# Patient Record
Sex: Male | Born: 1945 | Race: White | Hispanic: No | Smoking: Never smoker
Health system: Southern US, Community
[De-identification: ages and names within clinical notes are randomized; demographics above are authoritative.]

## PROBLEM LIST (undated history)

## (undated) DIAGNOSIS — C801 Malignant (primary) neoplasm, unspecified: Secondary | ICD-10-CM

## (undated) DIAGNOSIS — K219 Gastro-esophageal reflux disease without esophagitis: Secondary | ICD-10-CM

## (undated) DIAGNOSIS — I10 Essential (primary) hypertension: Secondary | ICD-10-CM

## (undated) DIAGNOSIS — IMO0002 Reserved for concepts with insufficient information to code with codable children: Secondary | ICD-10-CM

## (undated) DIAGNOSIS — M779 Enthesopathy, unspecified: Secondary | ICD-10-CM

## (undated) DIAGNOSIS — E785 Hyperlipidemia, unspecified: Secondary | ICD-10-CM

## (undated) DIAGNOSIS — T7840XA Allergy, unspecified, initial encounter: Secondary | ICD-10-CM

## (undated) HISTORY — DX: Malignant (primary) neoplasm, unspecified: C80.1

## (undated) HISTORY — DX: Reserved for concepts with insufficient information to code with codable children: IMO0002

## (undated) HISTORY — PX: TONSILLECTOMY: SUR1361

## (undated) HISTORY — DX: Hyperlipidemia, unspecified: E78.5

## (undated) HISTORY — DX: Essential (primary) hypertension: I10

## (undated) HISTORY — DX: Enthesopathy, unspecified: M77.9

## (undated) HISTORY — PX: COLONOSCOPY: SHX174

## (undated) HISTORY — DX: Gastro-esophageal reflux disease without esophagitis: K21.9

## (undated) HISTORY — DX: Allergy, unspecified, initial encounter: T78.40XA

---

## 2006-08-27 ENCOUNTER — Encounter: Payer: Self-pay | Admitting: Cardiovascular Disease

## 2006-09-06 ENCOUNTER — Encounter: Payer: Self-pay | Admitting: Cardiovascular Disease

## 2007-09-17 ENCOUNTER — Encounter: Payer: Self-pay | Admitting: Cardiovascular Disease

## 2007-10-15 ENCOUNTER — Ambulatory Visit (HOSPITAL_COMMUNITY): Admission: RE | Admit: 2007-10-15 | Discharge: 2007-10-15 | Payer: Self-pay | Admitting: Cardiovascular Disease

## 2008-02-25 ENCOUNTER — Ambulatory Visit (HOSPITAL_COMMUNITY): Admission: RE | Admit: 2008-02-25 | Discharge: 2008-02-25 | Payer: Self-pay | Admitting: Cardiovascular Disease

## 2008-03-17 ENCOUNTER — Encounter: Admission: RE | Admit: 2008-03-17 | Discharge: 2008-03-17 | Payer: Self-pay | Admitting: Internal Medicine

## 2008-08-11 ENCOUNTER — Ambulatory Visit: Payer: Self-pay | Admitting: Thoracic Surgery

## 2008-11-03 ENCOUNTER — Ambulatory Visit: Payer: Self-pay | Admitting: Thoracic Surgery

## 2008-11-03 ENCOUNTER — Encounter: Admission: RE | Admit: 2008-11-03 | Discharge: 2008-11-03 | Payer: Self-pay | Admitting: Thoracic Surgery

## 2008-12-03 ENCOUNTER — Ambulatory Visit: Payer: Self-pay | Admitting: Thoracic Surgery

## 2009-08-02 ENCOUNTER — Encounter: Admission: RE | Admit: 2009-08-02 | Discharge: 2009-08-02 | Payer: Self-pay | Admitting: Thoracic Surgery

## 2009-08-02 ENCOUNTER — Ambulatory Visit: Payer: Self-pay | Admitting: Thoracic Surgery

## 2010-02-10 ENCOUNTER — Encounter: Payer: Self-pay | Admitting: Cardiovascular Disease

## 2010-03-08 ENCOUNTER — Encounter: Payer: Self-pay | Admitting: Cardiovascular Disease

## 2010-03-09 ENCOUNTER — Encounter: Payer: Self-pay | Admitting: Cardiovascular Disease

## 2010-03-13 ENCOUNTER — Encounter: Payer: Self-pay | Admitting: Cardiovascular Disease

## 2010-03-15 ENCOUNTER — Encounter: Payer: Self-pay | Admitting: Cardiovascular Disease

## 2010-03-15 ENCOUNTER — Ambulatory Visit (INDEPENDENT_AMBULATORY_CARE_PROVIDER_SITE_OTHER): Payer: Self-pay | Admitting: Cardiovascular Disease

## 2010-03-15 DIAGNOSIS — I10 Essential (primary) hypertension: Secondary | ICD-10-CM

## 2010-03-15 DIAGNOSIS — E785 Hyperlipidemia, unspecified: Secondary | ICD-10-CM | POA: Insufficient documentation

## 2010-03-15 DIAGNOSIS — R0602 Shortness of breath: Secondary | ICD-10-CM | POA: Insufficient documentation

## 2010-03-21 NOTE — Letter (Signed)
Summary: Medical Record Release  Medical Record Release   Imported By: Harlon Flor 03/17/2010 16:31:54  _____________________________________________________________________  External Attachment:    Type:   Image     Comment:   External Document

## 2010-03-21 NOTE — Assessment & Plan Note (Signed)
Summary: SOB/AMD   Visit Type:  Initial Consult Referring Provider:  Dr.Tate Primary Provider:  Dr.Tate  CC:  c/o elevated BP, SOB, and tingling in fingers and trouble with balance. Denies chest pain.Marland Kitchen  History of Present Illness: Martin Lopez is a very pleasant 65 year old gentleman, patient of Dr. Arlana Pouch, known to me from [facility] with last visit in August 2009, with a history of hypertension, hyperlipidemia, strong family history of coronary artery disease who presents for shortness of breath and to reestablish care.  He reports that over the past 90 days, he has noticed increasing shortness of breath. He also has some tingling in his fingers, mild balance issues. His blood pressure has also been very elevated. He has had 2 add additional medications, starting amlodipine 5 mg daily.  He continues to treadmill every morning for the 16-24 minutes at a speed of 4 miles per hour on a flat level. He does not have worsening shortness of breath with exertion but notices his symptoms more at rest. He denies any significant chest pain. No significant lower extremity edema. He does have periodic cough.  EKG shows normal sinus rhythm with rate of 68 beats per minute, possible old septal infarct, no significant ST or T wave changes    Preventive Screening-Counseling & Management  Alcohol-Tobacco     Smoking Status: never  Caffeine-Diet-Exercise     Does Patient Exercise: yes      Drug Use:  no.    Current Medications (verified): 1)  Pantoprazole Sodium 40 Mg Tbec (Pantoprazole Sodium) .Marland Kitchen.. 1 Tablet Once Daily 2)  Vytorin 10-20 Mg Tabs (Ezetimibe-Simvastatin) .Marland Kitchen.. 1 Tablet Once Daily 3)  Terazosin Hcl 10 Mg Caps (Terazosin Hcl) .Marland Kitchen.. 1 Tablet Once Daily 4)  Avapro 300 Mg Tabs (Irbesartan) .Marland Kitchen.. 1 Tablet Once Daily  Allergies (verified): No Known Drug Allergies  Past History:  Family History: Last updated: April 14, 2010 Father:deceased-brain tumor Mother:ovarian  cancer maternal uncles-CAD  Social History: Last updated: 2010-04-14 Full Time Divorced  Tobacco Use - No.  Alcohol Use - yes-1 mixed drink a day Drug Use - no Regular Exercise - yes-uses treadmill  Risk Factors: Exercise: yes (04-14-10)  Risk Factors: Smoking Status: never (Apr 14, 2010)  Past Medical History: Hypertension Hyperlipidemia  Family History: Father:deceased-brain tumor Mother:ovarian cancer maternal uncles-CAD  Social History: Full Time Divorced  Tobacco Use - No.  Alcohol Use - yes-1 mixed drink a day Drug Use - no Regular Exercise - yes-uses treadmill Smoking Status:  never Drug Use:  no Does Patient Exercise:  yes  Review of Systems       The patient complains of dyspnea on exertion.  The patient denies fever, weight loss, weight gain, vision loss, decreased hearing, hoarseness, chest pain, syncope, peripheral edema, prolonged cough, abdominal pain, incontinence, muscle weakness, depression, and enlarged lymph nodes.         mild balance issues, tingling in his fingers  Vital Signs:  Patient profile:   65 year old male Height:      68 inches Weight:      175.50 pounds BMI:     26.78 Pulse rate:   68 / minute BP sitting:   151 / 99  (left arm) Cuff size:   regular  Vitals Entered By: Lysbeth Galas CMA (04/14/2010 11:58 AM)  Physical Exam  General:  Well developed, well nourished, in no acute distress. Head:  normocephalic and atraumatic Neck:  Neck supple, no JVD. No masses, thyromegaly or abnormal cervical nodes. Lungs:  Clear bilaterally to auscultation and percussion. Heart:  Non-displaced PMI, chest non-tender; regular rate and rhythm, S1, S2 without murmurs, rubs or gallops. Carotid upstroke normal, no bruit. Normal abdominal aortic size, no bruits. Pedals normal pulses. No edema, no varicosities. Abdomen:  Bowel sounds positive; abdomen soft and non-tender without masses Msk:  Back normal, normal gait. Muscle strength and  tone normal. Pulses:  pulses normal in all 4 extremities Extremities:  No clubbing or cyanosis. Neurologic:  Alert and oriented x 3. Skin:  Intact without lesions or rashes. Psych:  Normal affect.   Impression & Recommendations:  Problem # 1:  SHORTNESS OF BREATH (ICD-786.05) etiology of his shortness of breath is uncertain. Clinically, he appears well. EKG is essentially normal. He has no known underlying coronary artery disease and has been well-managed with good cholesterol. Blood pressure is borderline elevated.  Given his shortness of breath, he is requesting additional workup, and we have ordered an echocardiogram. We will send a report to Dr. Arlana Pouch when it is available and call the patient with the results. uncertain if additional workup will be needed. Previous calcium score in 2009 was zero.  We will try to obtain the CT report done this past year from [facility] for our records.  His updated medication list for this problem includes:    Avapro 300 Mg Tabs (Irbesartan) .Marland Kitchen... 1 tablet once daily    Amlodipine Besylate 5 Mg Tabs (Amlodipine besylate) .Marland Kitchen... Take one tablet by mouth daily    Aspirin 81 Mg Tbec (Aspirin) .Marland Kitchen... Take one tablet by mouth daily  Orders: EKG w/ Interpretation (93000) Echocardiogram (Echo)  Problem # 2:  HYPERTENSION, BENIGN (ICD-401.1) repeat blood pressure was 135/88. We have suggested that he continue to monitor his blood pressure for now. If it continues to be elevated, he should watch his diet, continue exercise, decrease salt intake, possibly increase amlodipine to 10 mg daily.  His updated medication list for this problem includes:    Terazosin Hcl 10 Mg Caps (Terazosin hcl) .Marland Kitchen... 1 tablet once daily    Avapro 300 Mg Tabs (Irbesartan) .Marland Kitchen... 1 tablet once daily    Amlodipine Besylate 5 Mg Tabs (Amlodipine besylate) .Marland Kitchen... Take one tablet by mouth daily    Aspirin 81 Mg Tbec (Aspirin) .Marland Kitchen... Take one tablet by mouth daily  Problem # 3:   HYPERLIPIDEMIA-MIXED (ICD-272.4) Cholesterol is reasonably well controlled on his current medication regimen.  His updated medication list for this problem includes:    Vytorin 10-20 Mg Tabs (Ezetimibe-simvastatin) .Marland Kitchen... 1 tablet once daily  Problem # 4:  PREVENTIVE HEALTH CARE (ICD-V70.0) We will recommend continuing on aspirin 81 mg x1. Continue to watch his diet, exercise as he is doing.  Patient Instructions: 1)  Your physician has requested that you have an echocardiogram.  Echocardiography is a painless test that uses sound waves to create images of your heart. It provides your doctor with information about the size and shape of your heart and how well your heart's chambers and valves are working.  This procedure takes approximately one hour. There are no restrictions for this procedure. 03/30/10 @ 9am

## 2010-03-30 ENCOUNTER — Other Ambulatory Visit: Payer: Self-pay | Admitting: Cardiovascular Disease

## 2010-03-30 ENCOUNTER — Other Ambulatory Visit (INDEPENDENT_AMBULATORY_CARE_PROVIDER_SITE_OTHER): Payer: BC Managed Care – PPO

## 2010-03-30 DIAGNOSIS — R0602 Shortness of breath: Secondary | ICD-10-CM

## 2010-03-30 DIAGNOSIS — R895 Abnormal microbiological findings in specimens from other organs, systems and tissues: Secondary | ICD-10-CM

## 2010-04-04 NOTE — Consult Note (Signed)
Summary: Internal Medicine: Progress Note  Internal Medicine: Progress Note   Imported By: Earl Many 03/28/2010 13:25:49  _____________________________________________________________________  External Attachment:    Type:   Image     Comment:   External Document

## 2010-04-04 NOTE — Progress Notes (Signed)
Summary: [facility]: Office Visit  [facility]: Office Visit   Imported By: Earl Many 03/28/2010 13:05:01  _____________________________________________________________________  External Attachment:    Type:   Image     Comment:   External Document

## 2010-04-04 NOTE — Progress Notes (Signed)
Summary: [facility]: Office Visit  [facility]: Office Visit   Imported By: Earl Many 03/28/2010 13:07:26  _____________________________________________________________________  External Attachment:    Type:   Image     Comment:   External Document

## 2010-06-06 NOTE — Letter (Signed)
November 03, 2008   Antonieta Iba, MD  [address]  [address]  [place], [place] [address]   Re:  Martin Lopez, Martin Lopez              DOB:  1945-02-08   Dear Dr. Mariah Milling,   I saw the patient in the office today and his blood pressure is 140/90,  pulse 59, respirations 18, sats were 98%.  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  Because of this, we will just follow him again in about 9 months  and this will probably be his last followup.   I appreciate the opportunity of seeing the patient.   Sincerely,   Ines Bloomer, M.D.  Electronically Signed   DPB/MEDQ  D:  11/03/2008  T:  11/04/2008  Job:  [id number]

## 2010-06-06 NOTE — Letter (Signed)
August 02, 2009   Antonieta Iba, MD  [address]  [address]  [place] [address]   Re:  Martin Lopez, Martin Lopez              DOB:  [birth year]   Dear Dr. Lewie Loron:   I saw the patient now for followup of his nodules, which we have been  following for approximately 2 years and there has been no change and  according to [place] these are probably all benign.  I would recommend that  he get another CT scan in 1 year and I will be happy to see him should  any other pulmonary problems develop.  His blood pressure is 142/86,  pulse 85, respirations 18, saturations were 97%.  Lungs are clear to  auscultation and percussion.   Ines Bloomer, M.D.  Electronically Signed   DPB/MEDQ  D:  08/02/2009  T:  08/03/2009  Job:  [id number]

## 2010-06-06 NOTE — Letter (Signed)
August 11, 2008   Antonieta Iba, MD  [address]  [address]  [place], [place] [address]   Re:  Martin Lopez, Martin Lopez              DOB:  03-12-1945   Dear Dr. Mariah Milling:   I saw the patient today for his lung nodules.  This 65 year old  Caucasian male, who was being worked up for cardiac disease, was found  to have a left lower lobe nodule near the fissure.  He had a followup CT  scan that showed a right middle lobe nodule, 8 mm in size and stable  left lower lobe nodules, both of which were noncalcified.  He has had no  fever, chills, or excessive sputum.  He does not smoke.   PAST MEDICAL HISTORY:  Significant for hypertension, hyperlipidemia,  previous cardiac workup was negative.  His medications include Nexium 40  mg a day, multivitamin, aspirin, Viagra, Avapro, Vytorin, and terazosin  5 mg a day.   FAMILY HISTORY:  Noncontributory.   SOCIAL HISTORY:  He is single.  He works in [employer].  He does  not smoke, has occasional drink per day.   REVIEW OF SYSTEMS:  He is 175 pounds.  He is 5 feet 8.  General:  His  weight has been stable.  Cardiac:  See history of present illness.  Pulmonary:  No hemoptysis.  GI:  No nausea, vomiting, constipation, or  diarrhea.  GU:  No kidney disease, dysuria, or frequent urination; has  probably some BPH.  Vascular:  No claudication, DVT, or TIAs.  Neurological:  No dizziness, headaches, blackouts, or seizures.  Musculoskeletal:  No arthritis or joint pain.  No psychiatric illnesses.  Eyes/ENT:  No change in his eyesight or hearing.  Hematological:  No  problems with bleeding, clotting disorders, or anemia.   PHYSICAL EXAMINATION:  GENERAL:  He is well-developed Caucasian male in  no acute distress.  VITAL SIGNS:  His blood pressure is 128/78, pulse 96, respirations 18,  and saturations were 97%.  HEAD, EYES, EARS, NOSE, AND THROAT:  Unremarkable.  NECK:  Supple without thyromegaly.  There is no supraclavicular or  axillary adenopathy.  CHEST:  Clear to auscultation and percussion.  HEART:  Regular sinus rhythm.  No murmurs.  ABDOMEN:  Soft.  No hepatosplenomegaly.  EXTREMITIES:  Pulses are 2+.  There is no clubbing or edema.  NEUROLOGICAL:  He is oriented x3.  Sensory and motor intact.  Cranial  nerves intact.   I think these two nodules just need to be followed.  Since it is under 8  mm, PET scan would not be helpful and obviously biopsy of such small  nodules could not be done with needle biopsy.  Since the left nodule has  been stable, we just need to make sure that the right nodule has been  stable also, so I plan to repeat.  The last CT scan was in February, so  we will plan to get his next CT in 2 months.  I will let you know our  findings.   Sincerely,   Ines Bloomer, M.D.  Electronically Signed   DPB/MEDQ  D:  08/11/2008  T:  08/12/2008  Job:  [id number]   cc:   Dr. Arlana Pouch

## 2010-08-15 ENCOUNTER — Encounter: Payer: Self-pay | Admitting: Cardiovascular Disease

## 2011-01-23 DIAGNOSIS — IMO0002 Reserved for concepts with insufficient information to code with codable children: Secondary | ICD-10-CM

## 2011-01-23 HISTORY — DX: Reserved for concepts with insufficient information to code with codable children: IMO0002

## 2011-01-23 HISTORY — PX: BASAL CELL CARCINOMA EXCISION: SHX1214

## 2011-03-08 ENCOUNTER — Ambulatory Visit: Payer: Self-pay | Admitting: Specialist

## 2011-03-21 ENCOUNTER — Ambulatory Visit (INDEPENDENT_AMBULATORY_CARE_PROVIDER_SITE_OTHER): Payer: BC Managed Care – PPO | Admitting: Cardiovascular Disease

## 2011-03-21 ENCOUNTER — Encounter: Payer: Self-pay | Admitting: Cardiovascular Disease

## 2011-03-21 VITALS — BP 118/86 | HR 66 | Ht 68.0 in | Wt 172.0 lb

## 2011-03-21 DIAGNOSIS — E785 Hyperlipidemia, unspecified: Secondary | ICD-10-CM

## 2011-03-21 DIAGNOSIS — I10 Essential (primary) hypertension: Secondary | ICD-10-CM

## 2011-03-21 DIAGNOSIS — R0602 Shortness of breath: Secondary | ICD-10-CM

## 2011-03-21 NOTE — Assessment & Plan Note (Signed)
Cholesterol is at goal on the current lipid regimen. No changes to the medications were made.  

## 2011-03-21 NOTE — Patient Instructions (Signed)
You are doing well. No medication changes were made. Monitor your blood pressure   Please call us if you have new issues that need to be addressed before your next appt.  Your physician wants you to follow-up in: 12 months.  You will receive a reminder letter in the mail two months in advance. If you don't receive a letter, please call our office to schedule the follow-up appointment.

## 2011-03-21 NOTE — Assessment & Plan Note (Addendum)
No significant shortness of breath on today's visit. Exercising well without complaints. No further workup needed at this time.

## 2011-03-21 NOTE — Progress Notes (Signed)
   Patient ID: Martin Lopez, male    DOB: 18-Aug-1945, 66 y.o.   MRN: [id number]  HPI Comments: Martin Lopez is a very pleasant 66 year old gentleman, patient of Dr. Arlana Pouch,  with a history of hypertension, hyperlipidemia, strong family history of coronary artery disease who presents  for routine followup.   He has been exercising well with no significant symptoms. No shortness of breath or chest pain when using the treadmill. He's been trying to watch his weight and his diet.  No significant lower extremity edema.    EKG shows normal sinus rhythm with rate of 66 beats per minute, possible old septal infarct, no significant ST or T wave changes     Outpatient Encounter Prescriptions as of 03/21/2011  Medication Sig Dispense Refill  . amLODipine (NORVASC) 5 MG tablet Take 5 mg by mouth daily.        Marland Kitchen aspirin (ASPIR-81) 81 MG EC tablet Take 81 mg by mouth daily.        Marland Kitchen esomeprazole (NEXIUM) 40 MG capsule Take 40 mg by mouth daily before breakfast.      . irbesartan (AVAPRO) 300 MG tablet Take 300 mg by mouth daily.        . rosuvastatin (CRESTOR) 10 MG tablet Take 10 mg by mouth daily.      Marland Kitchen terazosin (HYTRIN) 10 MG capsule Take 10 mg by mouth daily.        Marland Kitchen zolpidem (AMBIEN) 5 MG tablet Take 5 mg by mouth at bedtime as needed.        Review of Systems  Constitutional: Negative.   HENT: Negative.   Eyes: Negative.   Respiratory: Negative.   Cardiovascular: Negative.   Gastrointestinal: Negative.   Musculoskeletal: Negative.   Skin: Negative.   Neurological: Negative.   Hematological: Negative.   Psychiatric/Behavioral: Negative.   All other systems reviewed and are negative.    BP 118/86  Pulse 66  Ht 5\' 8"  (1.727 m)  Wt 172 lb (78.019 kg)  BMI 26.15 kg/m2   Physical Exam  Nursing note and vitals reviewed. Constitutional: He is oriented to person, place, and time. He appears well-developed and well-nourished.  HENT:  Head: Normocephalic.  Nose: Nose normal.    Mouth/Throat: Oropharynx is clear and moist.  Eyes: Conjunctivae are normal. Pupils are equal, round, and reactive to light.  Neck: Normal range of motion. Neck supple. No JVD present.  Cardiovascular: Normal rate, regular rhythm, S1 normal, S2 normal, normal heart sounds and intact distal pulses.  Exam reveals no gallop and no friction rub.   No murmur heard. Pulmonary/Chest: Effort normal and breath sounds normal. No respiratory distress. He has no wheezes. He has no rales. He exhibits no tenderness.  Abdominal: Soft. Bowel sounds are normal. He exhibits no distension. There is no tenderness.  Musculoskeletal: Normal range of motion. He exhibits no edema and no tenderness.  Lymphadenopathy:    He has no cervical adenopathy.  Neurological: He is alert and oriented to person, place, and time. Coordination normal.  Skin: Skin is warm and dry. No rash noted. No erythema.  Psychiatric: He has a normal mood and affect. His behavior is normal. Judgment and thought content normal.           Assessment and Plan

## 2011-03-21 NOTE — Assessment & Plan Note (Signed)
Blood pressure is well controlled on today's visit. No changes made to the medications. 

## 2011-07-12 ENCOUNTER — Ambulatory Visit: Payer: Self-pay | Admitting: Internal Medicine

## 2011-10-03 ENCOUNTER — Ambulatory Visit: Payer: Self-pay | Admitting: General Surgery

## 2012-03-17 ENCOUNTER — Encounter: Payer: Self-pay | Admitting: Cardiovascular Disease

## 2012-03-17 ENCOUNTER — Ambulatory Visit (INDEPENDENT_AMBULATORY_CARE_PROVIDER_SITE_OTHER): Payer: Commercial Managed Care - PPO | Admitting: Cardiovascular Disease

## 2012-03-17 VITALS — BP 122/82 | HR 64 | Ht 68.0 in | Wt 171.5 lb

## 2012-03-17 DIAGNOSIS — I10 Essential (primary) hypertension: Secondary | ICD-10-CM

## 2012-03-17 DIAGNOSIS — R252 Cramp and spasm: Secondary | ICD-10-CM

## 2012-03-17 DIAGNOSIS — E785 Hyperlipidemia, unspecified: Secondary | ICD-10-CM

## 2012-03-17 DIAGNOSIS — M542 Cervicalgia: Secondary | ICD-10-CM

## 2012-03-17 NOTE — Assessment & Plan Note (Signed)
Cholesterol currently well-controlled. He does have muscle aches. We'll hold his statin for now.

## 2012-03-17 NOTE — Assessment & Plan Note (Signed)
He has tightness in his neck, left flank, sometimes legs. We have suggested he do a trial hold his Crestor to see if symptoms improve over the next several weeks. If he does have improvement, we will try an alternate statin and low-dose. If no improvement, he could restart Crestor.

## 2012-03-17 NOTE — Assessment & Plan Note (Signed)
He reports having a bone spur. He has had MRIs and physical therapy with minimal improvement.

## 2012-03-17 NOTE — Progress Notes (Signed)
Patient ID: Martin Lopez, male    DOB: [birth year], 67 y.o.   MRN: [id number]  HPI Comments: Mr. Martin Lopez is a very pleasant 67year-old gentleman, patient of Dr. Arlana Pouch,  with a history of hypertension, hyperlipidemia, strong family history of coronary artery disease who presents  for routine followup.   His biggest complaint is chronic neck pain, hypersensitivity, some tingling down his arms. Also has left lower rib/flank pain that comes and goes. Also has cramping in his muscles, sometimes legs, sometimes neck.   He exercises on a regular basis uses the treadmill.No shortness of breath or chest pain when using the treadmill. He's been trying to watch his weight and his diet. No significant lower extremity edema.   Cholesterol well controlled less than 150 Crestor 10 mg daily No recent lipid panel since August 2013   EKG shows normal sinus rhythm with rate of 64 beats per minute, possible old septal infarct, no significant ST or T wave changes     Outpatient Encounter Prescriptions as of 03/17/2012  Medication Sig Dispense Refill  . amLODipine (NORVASC) 5 MG tablet Take 5 mg by mouth daily.        Marland Kitchen aspirin (ASPIR-81) 81 MG EC tablet Take 81 mg by mouth daily.        Marland Kitchen esomeprazole (NEXIUM) 40 MG capsule Take 40 mg by mouth daily before breakfast.      . irbesartan (AVAPRO) 300 MG tablet Take 300 mg by mouth daily.        . rosuvastatin (CRESTOR) 10 MG tablet Take 10 mg by mouth daily.      Marland Kitchen terazosin (HYTRIN) 10 MG capsule Take 10 mg by mouth daily.        Marland Kitchen zolpidem (AMBIEN) 5 MG tablet Take 5 mg by mouth at bedtime as needed.       No facility-administered encounter medications on file as of 03/17/2012.    Review of Systems  Constitutional: Negative.   HENT: Negative.   Eyes: Negative.   Respiratory: Negative.   Cardiovascular: Negative.   Gastrointestinal: Negative.   Musculoskeletal: Negative.   Skin: Negative.   Neurological: Negative.   Psychiatric/Behavioral: Negative.    All other systems reviewed and are negative.    BP 122/82  Pulse 64  Ht 5\' 8"  (1.727 m)  Wt 171 lb 8 oz (77.792 kg)  BMI 26.08 kg/m2  Physical Exam  Nursing note and vitals reviewed. Constitutional: He is oriented to person, place, and time. He appears well-developed and well-nourished.  HENT:  Head: Normocephalic.  Nose: Nose normal.  Mouth/Throat: Oropharynx is clear and moist.  Eyes: Conjunctivae are normal. Pupils are equal, round, and reactive to light.  Neck: Normal range of motion. Neck supple. No JVD present.  Cardiovascular: Normal rate, regular rhythm, S1 normal, S2 normal, normal heart sounds and intact distal pulses.  Exam reveals no gallop and no friction rub.   No murmur heard. Pulmonary/Chest: Effort normal and breath sounds normal. No respiratory distress. He has no wheezes. He has no rales. He exhibits no tenderness.  Abdominal: Soft. Bowel sounds are normal. He exhibits no distension. There is no tenderness.  Musculoskeletal: Normal range of motion. He exhibits no edema and no tenderness.  Lymphadenopathy:    He has no cervical adenopathy.  Neurological: He is alert and oriented to person, place, and time. Coordination normal.  Skin: Skin is warm and dry. No rash noted. No erythema.  Psychiatric: He has a normal mood and affect. His behavior is  normal. Judgment and thought content normal.      Assessment and Plan

## 2012-03-17 NOTE — Assessment & Plan Note (Signed)
Blood pressure well controlled. He is interested in weaning himself down off some of his medications. We have suggested he could cut his irbesartan in half and take 150 mg daily with close monitoring of his blood pressure.

## 2012-03-17 NOTE — Patient Instructions (Addendum)
You are doing well. Please do a trial hold on the crestor to see if neck and legs, aches, get better (for cholesterol) If no better in a month, call the office  Check the price of the   (for blood pressure)  Cut the irbesartan in 1/2 and monitor blood pressure  Goal top number <140, bottom < 90  Please call us if you have new issues that need to be addressed before your next appt.  Your physician wants you to follow-up in: 12 months.  You will receive a reminder letter in the mail two months in advance. If you don't receive a letter, please call our office to schedule the follow-up appointment.

## 2012-03-27 ENCOUNTER — Other Ambulatory Visit: Payer: Self-pay | Admitting: Neurosurgery

## 2012-03-27 DIAGNOSIS — M47812 Spondylosis without myelopathy or radiculopathy, cervical region: Secondary | ICD-10-CM

## 2012-04-03 ENCOUNTER — Ambulatory Visit
Admission: RE | Admit: 2012-04-03 | Discharge: 2012-04-03 | Disposition: A | Discharge status: Home / Self Care | Payer: Commercial Managed Care - PPO | Source: Ambulatory Visit | Attending: Neurosurgery | Admitting: Neurosurgery

## 2012-04-03 DIAGNOSIS — M47812 Spondylosis without myelopathy or radiculopathy, cervical region: Secondary | ICD-10-CM

## 2013-01-05 DIAGNOSIS — D239 Other benign neoplasm of skin, unspecified: Secondary | ICD-10-CM

## 2013-01-05 DIAGNOSIS — C4491 Basal cell carcinoma of skin, unspecified: Secondary | ICD-10-CM

## 2013-01-05 HISTORY — DX: Other benign neoplasm of skin, unspecified: D23.9

## 2013-01-05 HISTORY — DX: Basal cell carcinoma of skin, unspecified: C44.91

## 2013-03-17 ENCOUNTER — Ambulatory Visit: Payer: Medicare Other | Admitting: Cardiovascular Disease

## 2013-04-06 ENCOUNTER — Encounter: Payer: Self-pay | Admitting: Cardiovascular Disease

## 2013-04-06 ENCOUNTER — Ambulatory Visit (INDEPENDENT_AMBULATORY_CARE_PROVIDER_SITE_OTHER): Payer: Medicare Other | Admitting: Cardiovascular Disease

## 2013-04-06 VITALS — BP 120/80 | HR 67 | Ht 68.0 in | Wt 170.5 lb

## 2013-04-06 DIAGNOSIS — E785 Hyperlipidemia, unspecified: Secondary | ICD-10-CM

## 2013-04-06 DIAGNOSIS — I10 Essential (primary) hypertension: Secondary | ICD-10-CM

## 2013-04-06 MED ORDER — IRBESARTAN 300 MG PO TABS: 300.0000 mg | ORAL_TABLET | Freq: Every day | ORAL | Status: DC

## 2013-04-06 NOTE — Progress Notes (Signed)
   Patient ID: Martin Lopez, male    DOB: 08/29/1945, 68 y.o.   MRN: [id number]  HPI Comments: Martin Lopez is a very pleasant 68 year-old gentleman, patient of Dr. Hall Busing,  with a history of hypertension, hyperlipidemia, strong family history of coronary artery disease who presents  for routine followup. Prior cardiac calcium score of  Zero 3 years ago   Martin Lopez has chronic neck pain,  left and right lower rib/flank pain that comes and goes.  Cramping in his muscles has resolved with low-dose Crestor 5 mg  Martin Lopez exercises on a regular basis uses the treadmill.No shortness of breath or chest pain when using the treadmill. Martin Lopez's been trying to watch his weight and his diet. No significant lower extremity edema.   Cholesterol has climbed slightly on low-dose Crestor now up to 176, LDL 78, HDL 90 No recent lipid panel since August 2013   EKG shows normal sinus rhythm with rate 67 beats per minute, no significant ST or T wave changes     Outpatient Encounter Prescriptions as of 04/06/2013  Medication Sig  . amLODipine (NORVASC) 5 MG tablet Take 5 mg by mouth daily.    Marland Kitchen aspirin (ASPIR-81) 81 MG EC tablet Take 81 mg by mouth daily.    Marland Kitchen esomeprazole (NEXIUM) 40 MG capsule Take 40 mg by mouth daily before breakfast.  . irbesartan (AVAPRO) 300 MG tablet Take 150 mg by mouth daily.   . rosuvastatin (CRESTOR) 10 MG tablet Take 5 mg by mouth daily.   Marland Kitchen terazosin (HYTRIN) 10 MG capsule Take 10 mg by mouth daily.    Marland Kitchen zolpidem (AMBIEN) 5 MG tablet Take 5 mg by mouth at bedtime as needed.    Review of Systems  Constitutional: Negative.   HENT: Negative.   Eyes: Negative.   Respiratory: Negative.   Cardiovascular: Negative.   Gastrointestinal: Negative.   Endocrine: Negative.   Musculoskeletal: Negative.   Skin: Negative.   Allergic/Immunologic: Negative.   Neurological: Negative.   Hematological: Negative.   Psychiatric/Behavioral: Negative.   All other systems reviewed and are negative.    BP  120/80  Pulse 67  Ht 5\' 8"  (1.727 m)  Wt 170 lb 8 oz (77.338 kg)  BMI 25.93 kg/m2  Physical Exam  Nursing note and vitals reviewed. Constitutional: Martin Lopez is oriented to person, place, and time. Martin Lopez appears well-developed and well-nourished.  HENT:  Head: Normocephalic.  Nose: Nose normal.  Mouth/Throat: Oropharynx is clear and moist.  Eyes: Conjunctivae are normal. Pupils are equal, round, and reactive to light.  Neck: Normal range of motion. Neck supple. No JVD present.  Cardiovascular: Normal rate, regular rhythm, S1 normal, S2 normal, normal heart sounds and intact distal pulses.  Exam reveals no gallop and no friction rub.   No murmur heard. Pulmonary/Chest: Effort normal and breath sounds normal. No respiratory distress. Martin Lopez has no wheezes. Martin Lopez has no rales. Martin Lopez exhibits no tenderness.  Abdominal: Soft. Bowel sounds are normal. Martin Lopez exhibits no distension. There is no tenderness.  Musculoskeletal: Normal range of motion. Martin Lopez exhibits no edema and no tenderness.  Lymphadenopathy:    Martin Lopez has no cervical adenopathy.  Neurological: Martin Lopez is alert and oriented to person, place, and time. Coordination normal.  Skin: Skin is warm and dry. No rash noted. No erythema.  Psychiatric: Martin Lopez has a normal mood and affect. His behavior is normal. Judgment and thought content normal.      Assessment and Plan

## 2013-04-06 NOTE — Patient Instructions (Signed)
Please take terazosin at night Take a full irbesartan 300 mg pill Hold the amlodipine Monitor your blood pressure  Please call us if you have new issues that need to be addressed before your next appt.  Your physician wants you to follow-up in: 12 months.  You will receive a reminder letter in the mail two months in advance. If you don't receive a letter, please call our office to schedule the follow-up appointment.

## 2013-04-06 NOTE — Assessment & Plan Note (Signed)
He is interested in decreasing his medication list. One option would be to hold the amlodipine given his dizziness at times, increase the irbesartan back to 300 mg daily. Alternatively he could hold the irbesartan and increase amlodipine up to 10 mg daily. He will try the former first

## 2013-04-06 NOTE — Assessment & Plan Note (Signed)
Cholesterol is at goal on the current lipid regimen. No changes to the medications were made.  

## 2014-01-22 HISTORY — PX: COLONOSCOPY: SHX174

## 2014-02-11 ENCOUNTER — Encounter: Payer: Self-pay | Admitting: Gastroenterology

## 2014-03-31 ENCOUNTER — Ambulatory Visit: Payer: Medicare Other | Admitting: Gastroenterology

## 2014-04-06 ENCOUNTER — Encounter: Payer: Self-pay | Admitting: Cardiovascular Disease

## 2014-04-06 ENCOUNTER — Ambulatory Visit (INDEPENDENT_AMBULATORY_CARE_PROVIDER_SITE_OTHER): Payer: Medicare Other | Admitting: Cardiovascular Disease

## 2014-04-06 ENCOUNTER — Ambulatory Visit (INDEPENDENT_AMBULATORY_CARE_PROVIDER_SITE_OTHER): Payer: Medicare Other | Admitting: Gastroenterology

## 2014-04-06 ENCOUNTER — Encounter: Payer: Self-pay | Admitting: Gastroenterology

## 2014-04-06 VITALS — BP 134/72 | HR 80 | Ht 66.75 in | Wt 172.4 lb

## 2014-04-06 VITALS — BP 144/86 | HR 59 | Ht 68.0 in | Wt 172.5 lb

## 2014-04-06 DIAGNOSIS — R252 Cramp and spasm: Secondary | ICD-10-CM | POA: Diagnosis not present

## 2014-04-06 DIAGNOSIS — E785 Hyperlipidemia, unspecified: Secondary | ICD-10-CM | POA: Diagnosis not present

## 2014-04-06 DIAGNOSIS — I10 Essential (primary) hypertension: Secondary | ICD-10-CM

## 2014-04-06 DIAGNOSIS — K625 Hemorrhage of anus and rectum: Secondary | ICD-10-CM | POA: Insufficient documentation

## 2014-04-06 NOTE — Progress Notes (Signed)
Patient ID: Martin Lopez, male    DOB: Dec 12, 1945, 69 y.o.   MRN: [id number]  HPI Comments: Martin Lopez is a very pleasant 69 year-old gentleman, patient of Dr. Hall Busing,  with a history of hypertension, hyperlipidemia, strong family history of coronary artery disease who presents  for routine followup of his hypertension and hyperlipidemia.  Prior cardiac calcium score in 2009 of  Zero   In follow-up today, he reports that he is doing well. Continues to have chronic neck pain that comes and goes, also left lower rib/flank pain that comes and goes. Etiology unclear. Otherwise active, exercises every day in fact reports working out on the treadmill for the past 1900 days in a row. Denies having any anginal symptoms, no significant shortness of breath with exertion Cramping in his muscles has resolved with low-dose Crestor 5 mg  EKG on today's visit shows normal sinus rhythm with rate 59 bpm, no significant ST or T-wave changes  No Known Allergies  Outpatient Encounter Prescriptions as of 04/06/2014  Medication Sig  . amLODipine (NORVASC) 5 MG tablet Take 5 mg by mouth daily.    Marland Kitchen aspirin (ASPIR-81) 81 MG EC tablet Take 81 mg by mouth daily.    Marland Kitchen esomeprazole (NEXIUM) 40 MG capsule Take 40 mg by mouth daily before breakfast.  . irbesartan (AVAPRO) 300 MG tablet Take 1 tablet (300 mg total) by mouth daily.  . rosuvastatin (CRESTOR) 10 MG tablet Take 5 mg by mouth daily.   Marland Kitchen terazosin (HYTRIN) 10 MG capsule Take 10 mg by mouth daily.    Marland Kitchen zolpidem (AMBIEN) 5 MG tablet Take 5 mg by mouth at bedtime as needed.    Past Medical History  Diagnosis Date  . HTN (hypertension)   . HLD (hyperlipidemia)   . Bulging disc 2013  . Bone spur     kneck    Past Surgical History  Procedure Laterality Date  . Basal cell carcinoma excision  2013    right leg    Social History  reports that he has never smoked. He does not have any smokeless tobacco history on file. He reports that he drinks about  0.5 oz of alcohol per week. He reports that he does not use illicit drugs.  Family History family history includes Coronary artery disease in his maternal uncle and maternal uncle; Ovarian cancer in his mother.  Review of Systems  Constitutional: Negative.   Respiratory: Negative.   Cardiovascular: Negative.   Gastrointestinal: Negative.   Musculoskeletal: Negative.   Neurological: Negative.   Hematological: Negative.   Psychiatric/Behavioral: Negative.   All other systems reviewed and are negative.   BP 144/86 mmHg  Pulse 59  Ht 5\' 8"  (1.727 m)  Wt 172 lb 8 oz (78.245 kg)  BMI 26.23 kg/m2  Physical Exam  Constitutional: He is oriented to person, place, and time. He appears well-developed and well-nourished.  HENT:  Head: Normocephalic.  Nose: Nose normal.  Mouth/Throat: Oropharynx is clear and moist.  Eyes: Conjunctivae are normal. Pupils are equal, round, and reactive to light.  Neck: Normal range of motion. Neck supple. No JVD present.  Cardiovascular: Normal rate, regular rhythm, S1 normal, S2 normal, normal heart sounds and intact distal pulses.  Exam reveals no gallop and no friction rub.   No murmur heard. Pulmonary/Chest: Effort normal and breath sounds normal. No respiratory distress. He has no wheezes. He has no rales. He exhibits no tenderness.  Abdominal: Soft. Bowel sounds are normal. He exhibits no distension. There  is no tenderness.  Musculoskeletal: Normal range of motion. He exhibits no edema or tenderness.  Lymphadenopathy:    He has no cervical adenopathy.  Neurological: He is alert and oriented to person, place, and time. Coordination normal.  Skin: Skin is warm and dry. No rash noted. No erythema.  Psychiatric: He has a normal mood and affect. His behavior is normal. Judgment and thought content normal.      Assessment and Plan   Nursing note and vitals reviewed.

## 2014-04-06 NOTE — Patient Instructions (Signed)
You are doing well. No medication changes were made.  Please call us if you have new issues that need to be addressed before your next appt.  Your physician wants you to follow-up in: 12 months.  You will receive a reminder letter in the mail two months in advance. If you don't receive a letter, please call our office to schedule the follow-up appointment. 

## 2014-04-06 NOTE — Assessment & Plan Note (Signed)
Blood pressure is well controlled on today's visit. No changes made to the medications. 

## 2014-04-06 NOTE — Assessment & Plan Note (Signed)
Linda rectal bleeding is most likely hemorrhoidal.  In the face of a relatively recent colonoscopy I think a more proximal bleeding source is less likely.  Symptoms are minimal although the patient has some concern because of the persistent bleeding.    Recommendations #1 sigmoidoscopy #2 review recent colonoscopy report

## 2014-04-06 NOTE — Patient Instructions (Signed)
You have been scheduled for a flexible sigmoidoscopy. Please follow the written instructions given to you at your visit today. If you use inhalers (even only as needed), please bring them with you on the day of your procedure.  We will request colonoscopy records from 2013 from Dr Angie Fava office.

## 2014-04-06 NOTE — Assessment & Plan Note (Signed)
Cramping better on low-dose Crestor. Left lower rib pain likely musculoskeletal in nature

## 2014-04-06 NOTE — Assessment & Plan Note (Signed)
He will stay on his Crestor 5 mg every other day Last cholesterol 170s

## 2014-04-06 NOTE — Progress Notes (Signed)
_                                                                                                                History of Present Illness:  Martin Lopez is a pleasant 69 year old white male referred at the request of Dr. Hall Busing for evaluation of rectal bleeding.  He has noticed intermittent bright red blood on the toilet tissue following a bowel movement.  He's also noted protrusion what he thinks is hemorrhoids.  He is without abdominal or rectal pain.  There has been no change in bowel habits.  According to the patient last colonoscopy was about 3 years ago.  He has used various topicals and suppositories without change in his symptoms.   Past Medical History  Diagnosis Date  . HTN (hypertension)   . HLD (hyperlipidemia)   . Bulging disc 2013  . Bone spur     kneck   Past Surgical History  Procedure Laterality Date  . Basal cell carcinoma excision  2013    right leg   family history includes Coronary artery disease in his maternal uncle; Ovarian cancer in his mother. There is no history of Colon cancer, Colon polyps, Kidney disease, Diabetes, Esophageal cancer, or Gallbladder disease. Current Outpatient Prescriptions  Medication Sig Dispense Refill  . aspirin (ASPIR-81) 81 MG EC tablet Take 81 mg by mouth daily.      Marland Kitchen esomeprazole (NEXIUM) 40 MG capsule Take 40 mg by mouth daily before breakfast.    . irbesartan (AVAPRO) 300 MG tablet Take 1 tablet (300 mg total) by mouth daily. 90 tablet 3  . rosuvastatin (CRESTOR) 10 MG tablet Take 5 mg by mouth every other day.     . terazosin (HYTRIN) 10 MG capsule Take 10 mg by mouth daily.      Marland Kitchen triamcinolone cream (KENALOG) 0.1 % Apply 1 application topically as needed.   0  . valACYclovir (VALTREX) 1000 MG tablet Take 1,000 mg by mouth as needed.   1  . zolpidem (AMBIEN) 5 MG tablet Take 5 mg by mouth at bedtime as needed. Pt takes 1/2 tablet each night     No current facility-administered medications for this visit.    Allergies as of 04/06/2014  . (No Known Allergies)    reports that he has never smoked. He has never used smokeless tobacco. He reports that he drinks about 0.6 oz of alcohol per week. He reports that he does not use illicit drugs.   Review of Systems: Patient has a stiff neck Pertinent positive and negative review of systems were noted in the above HPI section. All other review of systems were otherwise negative.  Vital signs were reviewed in today's medical record Physical Exam: General: Well developed , well nourished, no acute distress Skin: anicteric Head: Normocephalic and atraumatic Eyes:  sclerae anicteric, EOMI Ears: Normal auditory acuity Mouth: No deformity or lesions Neck: Supple, no masses or thyromegaly Lungs: Clear throughout to auscultation Heart: Regular rate and rhythm; no rubs or bruits.  There  is a 1/6 early systolic murmur Abdomen: Soft, non tender and non distended. No masses, hepatosplenomegaly or hernias noted. Normal Bowel sounds Rectal: There are no rectal masses.  There is no stool in the rectal vault.  Heme positive Musculoskeletal: Symmetrical with no gross deformities  Skin: No lesions on visible extremities Pulses:  Normal pulses noted Extremities: No clubbing, cyanosis, edema or deformities noted Neurological: Alert oriented x 4, grossly nonfocal Cervical Nodes:  No significant cervical adenopathy Inguinal Nodes: No significant inguinal adenopathy Psychological:  Alert and cooperative. Normal mood and affect  See Assessment and Plan under Problem List

## 2014-04-20 ENCOUNTER — Other Ambulatory Visit: Payer: Medicare Other | Admitting: Gastroenterology

## 2014-04-22 ENCOUNTER — Encounter: Payer: Self-pay | Admitting: Gastroenterology

## 2014-04-22 ENCOUNTER — Ambulatory Visit (AMBULATORY_SURGERY_CENTER): Payer: Medicare Other | Admitting: Gastroenterology

## 2014-04-22 VITALS — BP 143/90 | HR 59 | Temp 96.8°F | Resp 39 | Ht 66.75 in | Wt 172.0 lb

## 2014-04-22 DIAGNOSIS — K625 Hemorrhage of anus and rectum: Secondary | ICD-10-CM

## 2014-04-22 DIAGNOSIS — K5730 Diverticulosis of large intestine without perforation or abscess without bleeding: Secondary | ICD-10-CM

## 2014-04-22 DIAGNOSIS — K648 Other hemorrhoids: Secondary | ICD-10-CM

## 2014-04-22 MED ORDER — SODIUM CHLORIDE 0.9 % IV SOLN: 500.0000 mL | INTRAVENOUS | Status: DC

## 2014-04-22 NOTE — Op Note (Signed)
[facility]  [address] [place], [id number]   COLONOSCOPY PROCEDURE REPORT  PATIENT: Martin, Lopez  MR#: [id number] BIRTHDATE: [birth year] , 69  yrs. old GENDER: male ENDOSCOPIST: Inda Castle, MD REFERRED BY: PROCEDURE DATE:  04/22/2014 PROCEDURE:   Colonoscopy, diagnostic First Screening Colonoscopy - Avg.  risk and is 50 yrs.  old or older - No.  Prior Negative Screening - Now for repeat screening. Other: See Comments  History of Adenoma - Now for follow-up colonoscopy & has been > or = to 3 yrs.  N/A ASA CLASS:   Class II INDICATIONS:Evaluation of unexplained GI bleeding and rectal bleeding.   patient had a colonoscopy in 2013.  patient was prepped for sigmoidoscopy.  Because of the excellent prep and ease of procedure full colonoscopy was performed. MEDICATIONS: Monitored anesthesia care and Propofol 200 mg IV  DESCRIPTION OF PROCEDURE:   After the risks benefits and alternatives of the procedure were thoroughly explained, informed consent was obtained.  The digital rectal exam revealed no abnormalities of the rectum.   The LB [id number]-[id number]  endoscope was introduced through the anus and advanced to the cecum, which was identified by both the appendix and ileocecal valve. No adverse events experienced.   The quality of the prep was (Fleets Enemas was used) good.  The instrument was then slowly withdrawn as the colon was fully examined.      COLON FINDINGS: There was moderate diverticulosis noted in the sigmoid colon.   Internal hemorrhoids were found.   The examination was otherwise normal.  Retroflexed views revealed no abnormalities. The time to cecum = -8.6 Withdrawal time = 7.5   The scope was withdrawn and the procedure completed. COMPLICATIONS: There were no immediate complications.  ENDOSCOPIC IMPRESSION: 1.   Moderate diverticulosis was noted in the sigmoid colon 2.   Internal hemorrhoids 3.   The examination was otherwise  normal  RECOMMENDATIONS: Hemorrhoidal suppositories To consider band ligation if bleeding persists Colonoscopy 10 years  eSigned:  Inda Castle, MD 04/22/2014 10:21 AM   cc: Martin Stabile, MD   PATIENT NAME:  [name], [name] MR#: [id number]

## 2014-04-22 NOTE — Progress Notes (Signed)
Report to PACU, RN, vss, BBS= Clear.  

## 2014-04-22 NOTE — Patient Instructions (Addendum)
YOU HAD AN ENDOSCOPIC PROCEDURE TODAY AT [facility]:   Refer to the procedure report that was given to you for any specific questions about what was found during the examination.  If the procedure report does not answer your questions, please call your gastroenterologist to clarify.  If you requested that your care partner not be given the details of your procedure findings, then the procedure report has been included in a sealed envelope for you to review at your convenience later.  YOU SHOULD EXPECT: Some feelings of bloating in the abdomen. Passage of more gas than usual.  Walking can help get rid of the air that was put into your GI tract during the procedure and reduce the bloating. If you had a lower endoscopy (such as a colonoscopy or flexible sigmoidoscopy) you may notice spotting of blood in your stool or on the toilet paper. If you underwent a bowel prep for your procedure, you may not have a normal bowel movement for a few days.  Please Note:  You might notice some irritation and congestion in your nose or some drainage.  This is from the oxygen used during your procedure.  There is no need for concern and it should clear up in a day or so.  SYMPTOMS TO REPORT IMMEDIATELY:   Following lower endoscopy (colonoscopy or flexible sigmoidoscopy):  Excessive amounts of blood in the stool  Significant tenderness or worsening of abdominal pains  Swelling of the abdomen that is new, acute  Fever of 100F or higher    For urgent or emergent issues, a gastroenterologist can be reached at any hour by calling [phone].   DIET: Your first meal following the procedure should be a small meal and then it is ok to progress to your normal diet. Heavy or fried foods are harder to digest and may make you feel nauseous or bloated.  Likewise, meals heavy in dairy and vegetables can increase bloating.  Drink plenty of fluids but you should avoid alcoholic beverages for 24  hours.  ACTIVITY:  You should plan to take it easy for the rest of today and you should NOT DRIVE or use heavy machinery until tomorrow (because of the sedation medicines used during the test).    FOLLOW UP: Our staff will call the number listed on your records the next business day following your procedure to check on you and address any questions or concerns that you may have regarding the information given to you following your procedure. If we do not reach you, we will leave a message.  However, if you are feeling well and you are not experiencing any problems, there is no need to return our call.  We will assume that you have returned to your regular daily activities without incident.  If any biopsies were taken you will be contacted by phone or by letter within the next 1-3 weeks.  Please call us at [phone] if you have not heard about the biopsies in 3 weeks.    SIGNATURES/CONFIDENTIALITY: You and/or your care partner have signed paperwork which will be entered into your electronic medical record.  These signatures attest to the fact that that the information above on your After Visit Summary has been reviewed and is understood.  Full responsibility of the confidentiality of this discharge information lies with you and/or your care-partner.   Information on hemorrhoids & hemorrhoid banding given to you today  Information on diverticulosis given to you today

## 2014-04-23 ENCOUNTER — Telehealth: Payer: Self-pay | Admitting: *Deleted

## 2014-04-23 NOTE — Telephone Encounter (Signed)
  Follow up Call-  Call back number 04/22/2014  Post procedure Call Back phone  # [phone]  Permission to leave phone message Yes    [facility]

## 2014-06-14 ENCOUNTER — Other Ambulatory Visit: Payer: Self-pay | Admitting: Cardiovascular Disease

## 2014-09-16 ENCOUNTER — Encounter: Payer: Self-pay | Admitting: *Deleted

## 2014-09-16 ENCOUNTER — Other Ambulatory Visit: Payer: Medicare Other

## 2014-09-16 NOTE — Patient Instructions (Signed)
  Your procedure is scheduled on: 09-21-14 Report to [place]. To find out your arrival time please call [phone] between 1PM - 3PM on 09-20-14 [facility])  Remember: Instructions that are not followed completely may result in serious medical risk, up to and including death, or upon the discretion of your surgeon and anesthesiologist your surgery may need to be rescheduled.    _X___ 1. Do not eat food or drink liquids after midnight. No gum chewing or hard candies.     _X___ 2. No Alcohol for 24 hours before or after surgery.   ____ 3. Bring all medications with you on the day of surgery if instructed.    _X___ 4. Notify your doctor if there is any change in your medical condition     (cold, fever, infections).     Do not wear jewelry, make-up, hairpins, clips or nail polish.  Do not wear lotions, powders, or perfumes. You may wear deodorant.  Do not shave 48 hours prior to surgery. Men may shave face and neck.  Do not bring valuables to the hospital.    [facility] is not responsible for any belongings or valuables.               Contacts, dentures or bridgework may not be worn into surgery.  Leave your suitcase in the car. After surgery it may be brought to your room.  For patients admitted to the hospital, discharge time is determined by your treatment team.   Patients discharged the day of surgery will not be allowed to drive home.   Please read over the following fact sheets that you were given:      __X__ Take these medicines the morning of surgery with A SIP OF WATER:    1. AVAPRO  2. HYTRIN  3. CRESTOR (IF IT IS ON YOUR DAY TO TAKE IT)  4. [place]  5. TAKE AN EXTRA DOSE OF NEXIUM ON Monday NIGHT  6.  ____ Fleet Enema (as directed)   ____ Use CHG Soap as directed  ____ Use inhalers on the day of surgery  ____ Stop metformin 2 days prior to surgery    ____ Take 1/2 of usual insulin dose the night before surgery and none on the  morning of surgery.   ____ Stop Coumadin/Plavix/aspirin-PT STOPPED ASPIRIN LAST WEEK  ____ Stop Anti-inflammatories-NO NSAIDS OR ASPIRIN PRODUCTS-TYLENOL OK   ____ Stop supplements until after surgery.    ____ Bring C-Pap to the hospital.

## 2014-09-17 ENCOUNTER — Other Ambulatory Visit: Payer: Medicare Other

## 2014-09-21 ENCOUNTER — Ambulatory Visit: Payer: Medicare Other | Admitting: Anesthesiology

## 2014-09-21 ENCOUNTER — Encounter: Payer: Self-pay | Admitting: *Deleted

## 2014-09-21 ENCOUNTER — Encounter
Admission: RE | Disposition: A | Discharge status: Home / Self Care | Payer: Self-pay | Source: Ambulatory Visit | Attending: Urology

## 2014-09-21 ENCOUNTER — Ambulatory Visit
Admission: RE | Admit: 2014-09-21 | Discharge: 2014-09-21 | Disposition: A | Discharge status: Home / Self Care | Payer: Medicare Other | Source: Ambulatory Visit | Attending: Urology | Admitting: Urology

## 2014-09-21 DIAGNOSIS — E780 Pure hypercholesterolemia: Secondary | ICD-10-CM | POA: Diagnosis not present

## 2014-09-21 DIAGNOSIS — Z8249 Family history of ischemic heart disease and other diseases of the circulatory system: Secondary | ICD-10-CM | POA: Diagnosis not present

## 2014-09-21 DIAGNOSIS — K219 Gastro-esophageal reflux disease without esophagitis: Secondary | ICD-10-CM | POA: Insufficient documentation

## 2014-09-21 DIAGNOSIS — Z7982 Long term (current) use of aspirin: Secondary | ICD-10-CM | POA: Insufficient documentation

## 2014-09-21 DIAGNOSIS — N401 Enlarged prostate with lower urinary tract symptoms: Secondary | ICD-10-CM | POA: Insufficient documentation

## 2014-09-21 DIAGNOSIS — I10 Essential (primary) hypertension: Secondary | ICD-10-CM | POA: Insufficient documentation

## 2014-09-21 DIAGNOSIS — J309 Allergic rhinitis, unspecified: Secondary | ICD-10-CM | POA: Diagnosis not present

## 2014-09-21 DIAGNOSIS — N400 Enlarged prostate without lower urinary tract symptoms: Secondary | ICD-10-CM | POA: Diagnosis present

## 2014-09-21 DIAGNOSIS — R0602 Shortness of breath: Secondary | ICD-10-CM | POA: Diagnosis not present

## 2014-09-21 DIAGNOSIS — N138 Other obstructive and reflux uropathy: Secondary | ICD-10-CM | POA: Insufficient documentation

## 2014-09-21 HISTORY — PX: GREEN LIGHT LASER TURP (TRANSURETHRAL RESECTION OF PROSTATE: SHX6260

## 2014-09-21 SURGERY — GREEN LIGHT LASER TURP (TRANSURETHRAL RESECTION OF PROSTATE
Anesthesia: General | Wound class: Clean Contaminated

## 2014-09-21 MED ORDER — SODIUM CHLORIDE 0.9 % IR SOLN
Status: DC | PRN
  Administered 2014-09-21: 6000 mL

## 2014-09-21 MED ORDER — LEVOFLOXACIN IN D5W 500 MG/100ML IV SOLN
INTRAVENOUS | Status: AC
  Filled 2014-09-21: qty 100

## 2014-09-21 MED ORDER — NUCYNTA 50 MG PO TABS: 50.0000 mg | ORAL_TABLET | Freq: Four times a day (QID) | ORAL | Status: DC | PRN

## 2014-09-21 MED ORDER — BELLADONNA ALKALOIDS-OPIUM 16.2-60 MG RE SUPP
RECTAL | Status: AC
  Filled 2014-09-21: qty 1

## 2014-09-21 MED ORDER — BELLADONNA ALKALOIDS-OPIUM 16.2-60 MG RE SUPP
RECTAL | Status: DC | PRN
  Administered 2014-09-21: 1 via RECTAL

## 2014-09-21 MED ORDER — PROPOFOL 10 MG/ML IV BOLUS
INTRAVENOUS | Status: DC | PRN
  Administered 2014-09-21: 170 mg via INTRAVENOUS

## 2014-09-21 MED ORDER — ONDANSETRON HCL 4 MG/2ML IJ SOLN
INTRAMUSCULAR | Status: DC | PRN
  Administered 2014-09-21: 4 mg via INTRAVENOUS

## 2014-09-21 MED ORDER — FENTANYL CITRATE (PF) 100 MCG/2ML IJ SOLN
INTRAMUSCULAR | Status: AC
  Administered 2014-09-21: 25 ug via INTRAVENOUS
  Filled 2014-09-21: qty 2

## 2014-09-21 MED ORDER — FENTANYL CITRATE (PF) 100 MCG/2ML IJ SOLN
25.0000 ug | INTRAMUSCULAR | Status: DC | PRN
  Administered 2014-09-21 (×4): 25 ug via INTRAVENOUS

## 2014-09-21 MED ORDER — LEVOFLOXACIN IN D5W 500 MG/100ML IV SOLN
500.0000 mg | Freq: Once | INTRAVENOUS | Status: AC
  Administered 2014-09-21: 500 mg via INTRAVENOUS

## 2014-09-21 MED ORDER — ONDANSETRON HCL 4 MG/2ML IJ SOLN: 4.0000 mg | Freq: Once | INTRAMUSCULAR | Status: DC | PRN

## 2014-09-21 MED ORDER — FENTANYL CITRATE (PF) 100 MCG/2ML IJ SOLN
INTRAMUSCULAR | Status: DC | PRN
  Administered 2014-09-21 (×3): 25 ug via INTRAVENOUS

## 2014-09-21 MED ORDER — LACTATED RINGERS IV SOLN
INTRAVENOUS | Status: DC
  Administered 2014-09-21: 13:00:00 via INTRAVENOUS

## 2014-09-21 MED ORDER — LIDOCAINE HCL 2 % EX GEL
CUTANEOUS | Status: AC
  Filled 2014-09-21: qty 10

## 2014-09-21 MED ORDER — EPHEDRINE SULFATE 50 MG/ML IJ SOLN
INTRAMUSCULAR | Status: DC | PRN
  Administered 2014-09-21: 10 mg via INTRAVENOUS

## 2014-09-21 MED ORDER — DOCUSATE SODIUM 100 MG PO CAPS: 200.0000 mg | ORAL_CAPSULE | Freq: Two times a day (BID) | ORAL | Status: DC

## 2014-09-21 MED ORDER — URIBEL 118 MG PO CAPS: 1.0000 | ORAL_CAPSULE | Freq: Four times a day (QID) | ORAL | Status: DC | PRN

## 2014-09-21 MED ORDER — LIDOCAINE HCL (CARDIAC) 20 MG/ML IV SOLN
INTRAVENOUS | Status: DC | PRN
  Administered 2014-09-21: 60 mg via INTRAVENOUS

## 2014-09-21 MED ORDER — MIDAZOLAM HCL 5 MG/5ML IJ SOLN
INTRAMUSCULAR | Status: DC | PRN
  Administered 2014-09-21: 2 mg via INTRAVENOUS

## 2014-09-21 MED ORDER — LEVOFLOXACIN 500 MG PO TABS: 500.0000 mg | ORAL_TABLET | Freq: Every day | ORAL | Status: DC

## 2014-09-21 MED ORDER — LIDOCAINE HCL 2 % EX GEL
CUTANEOUS | Status: DC | PRN
  Administered 2014-09-21: 1

## 2014-09-21 SURGICAL SUPPLY — 29 items
ADAPTER IRRIG TUBE 2 SPIKE SOL (ADAPTER) ×4 IMPLANT
ADPR TBG 2 SPK PMP STRL ASCP (ADAPTER) ×2
BAG URO DRAIN 2000ML W/SPOUT (MISCELLANEOUS) ×2 IMPLANT
CATH FOLEY 2WAY  5CC 20FR SIL (CATHETERS) ×1
CATH FOLEY 2WAY 5CC 20FR SIL (CATHETERS) ×1 IMPLANT
GLOVE BIO SURGEON STRL SZ7 (GLOVE) ×4 IMPLANT
GLOVE BIO SURGEON STRL SZ7.5 (GLOVE) ×2 IMPLANT
GOWN STRL REUS W/ TWL LRG LVL3 (GOWN DISPOSABLE) ×1 IMPLANT
GOWN STRL REUS W/ TWL XL LVL3 (GOWN DISPOSABLE) ×1 IMPLANT
GOWN STRL REUS W/TWL LRG LVL3 (GOWN DISPOSABLE) ×2
GOWN STRL REUS W/TWL XL LVL3 (GOWN DISPOSABLE) ×2
IV NS 1000ML (IV SOLUTION) ×2
IV NS 1000ML BAXH (IV SOLUTION) ×1 IMPLANT
IV SET PRIMARY 15D 139IN B9900 (IV SETS) ×2 IMPLANT
KIT RM TURNOVER CYSTO AR (KITS) ×2 IMPLANT
LASER GRNLGT 950 (MISCELLANEOUS) ×2 IMPLANT
LASER GRNLGT MOXY FIBER 750UM (MISCELLANEOUS) ×1 IMPLANT
PACK CYSTO AR (MISCELLANEOUS) ×2 IMPLANT
PREP PVP WINGED SPONGE (MISCELLANEOUS) ×2 IMPLANT
SET IRRIG Y TYPE TUR BLADDER L (SET/KITS/TRAYS/PACK) ×2 IMPLANT
SOL .9 NS 3000ML IRR  AL (IV SOLUTION) ×4
SOL .9 NS 3000ML IRR AL (IV SOLUTION) ×4
SOL .9 NS 3000ML IRR UROMATIC (IV SOLUTION) ×4 IMPLANT
SOL PREP PVP 2OZ (MISCELLANEOUS) ×2
SOLUTION PREP PVP 2OZ (MISCELLANEOUS) ×1 IMPLANT
SURGILUBE 2OZ TUBE FLIPTOP (MISCELLANEOUS) ×2 IMPLANT
SYRINGE IRR TOOMEY STRL 70CC (SYRINGE) ×2 IMPLANT
TUBING CONNECTING 10 (TUBING) ×2 IMPLANT
WATER STERILE IRR 1000ML POUR (IV SOLUTION) ×2 IMPLANT

## 2014-09-21 NOTE — Op Note (Signed)
Preoperative diagnosis:  1. 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 As above   Procedure:  Photovaporization of the prostate with greenlight laser  Surgeon: Otelia Limes. Yves Dill MD, FACS   Anesthesia: General   Complications: None  Intraoperative findings: Trilobar BPH with visual obstruction   Watts:  80-120  EBL: Minimal  Specimens: None  Indication: Martin Lopez is a 69 year old patient with BPH and bladder outlet obstruction.  After reviewing the management options for treatment, he elected to proceed with the above surgical procedure(s). We have discussed the potential benefits and risks of the procedure, side effects of the proposed treatment, the likelihood of the patient achieving the goals of the procedure, and any potential problems that might occur during the procedure or recuperation. Informed consent has been obtained.  Description of procedure: The patient was taken to the operating room and general anesthesia was induced.  The patient was placed in the dorsal lithotomy position, prepped and draped in the usual sterile fashion, and preoperative antibiotics were administered. A preoperative time-out was performed.   The patient was taken to the operating room and general anesthesia was induced. The patient was placed in the dorsal lithotomy position, prepped and draped in the usual sterile fashion, and preoperative antibiotics were administered. A preoperative time-out was performed.   The laser scope was coupled to the camera and visually advanced into the bladder. Bladder was thoroughly inspected and no lesions or tumors identified. Both ureteral orifices were identified and had clear efflux. The patient had trlobar BPH. The XPS greenlight laser fiber was passed through the scope. The power was set at 80 watts and the median lobe and bladder neck tissue was vaporized. Remaining obstructive tissue from the bladder neck  to the verumontanum was vaporized at 80-120 watts.  Once an adequate channel had been created, all  bleeders were cauterized. Urojet lidocaine was injected into the urethra and bladder. A 96E silicone Foley catheter was placed into the patient's urethra and the bladder irrigated until the effluent was clear. A B&O suppository was placed. The patient was subsequently extubated and returned to the PACU in excellent condition. There were no immediate complications.  Disposition:  The patient will be discharged home today with Foley catheter placed, and return in 1-2 days for a voiding trial.

## 2014-09-21 NOTE — Anesthesia Procedure Notes (Signed)
Procedure Name: LMA Insertion Date/Time: 09/21/2014 1:05 PM Performed by: Dionne Bucy Pre-anesthesia Checklist: Patient identified, Patient being monitored, Timeout performed, Emergency Drugs available and Suction available Patient Re-evaluated:Patient Re-evaluated prior to inductionOxygen Delivery Method: Circle system utilized Preoxygenation: Pre-oxygenation with 100% oxygen Intubation Type: IV induction Ventilation: Mask ventilation without difficulty LMA: LMA inserted LMA Size: 5.0 Tube type: Oral Number of attempts: 1 Placement Confirmation: positive ETCO2 and breath sounds checked- equal and bilateral Tube secured with: Tape Dental Injury: Teeth and Oropharynx as per pre-operative assessment

## 2014-09-21 NOTE — H&P (Signed)
NAME:  KITO, CUFFE NO.:  [id number]  MEDICAL RECORD NO.:  [id number]  LOCATION:  PERIO                        FACILITY:  [facility]  PHYSICIAN:  Maryan Puls          DATE OF BIRTH:  1945/02/06  DATE OF ADMISSION:  09/13/2014 DATE OF DISCHARGE:                            HISTORY AND PHYSICAL   Same-day surgery September 21, 2014.  CHIEF COMPLAINT:  Difficulty voiding.  HISTORY OF PRESENT ILLNESS:  Mr. Modi is a 69 year old Caucasian male with a greater than 1 year history of difficulty voiding.  Symptoms include frequency, intermittency, urgency, weak stream straining, and nocturia.  Treatment with terazosin was not effective.  Also conversion to Rapaflo treatment was not effective.  Evaluation in the office included a uroflow study which indicated average flow rate of 6 mL/second with a maximum flow rate 15 mL/second and a residual of 52 mL. Prostate ultrasound revealed a 39.8 g prostate.  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  PSA was 2.85 ng/mL on July 20, 2014.  The patient comes in now for photovaporization of the prostate with GreenLight laser.  PAST MEDICAL HISTORY:  No drug allergies.  CURRENT MEDICATION:  Include esomeprazole, irbesartan, terazosin, Crestor, Centrum Silver, and aspirin.  PAST SURGICAL HISTORY: 1. Cystoscopy in 2001. 2. Upper and lower endoscopy in 2016.  SOCIAL HISTORY:  The patient consumes 2 alcoholic beverages per week. He denied alcohol use.  FAMILY HISTORY:  The patient has uncles with heart disease.  Mother is alive and well at age 83.  There is no history of renal disease or prostate cancer.  PAST AND CURRENT MEDICAL CONDITIONS: 1. Hypertension. 2. Hypercholesterolemia. 3. Allergic rhinitis 4. GERD.  REVIEW OF SYSTEMS:  The patient denied chest pain, shortness of breath, diabetes, or stroke.  PHYSICAL EXAMINATION:  GENERAL:  A well-nourished white male, in no acute distress. HEENT:   Sclerae were clear.  Pupils were equally round, reactive to light and accommodation.  Extraocular movements were intact. NECK:  Supple.  No palpable cervical adenopathy. LUNGS:  Clear to auscultation. CARDIOVASCULAR:  Regular rhythm and rate without audible murmurs. Abdomen:  Soft, nontender abdomen. GU:  Circumcised testes, smooth and nontender, 20 mL size each. RECTAL:  35 g smooth, nontender prostate. NEUROMUSCULAR:  Alert and oriented x3.  IMPRESSION:  Benign prostatic hyperplasia with bladder outlet obstruction.  PLAN:  Photovaporization of prostate with GreenLight laser.          ______________________________ Maryan Puls     MW/MEDQ  D:  09/20/2014  T:  09/21/2014  Job:  [id number]

## 2014-09-21 NOTE — H&P (Signed)
Date of Initial H&P: 09/13/14  History reviewed, patient examined, no change in status, stable for surgery.

## 2014-09-21 NOTE — Discharge Instructions (Addendum)
Benign Prostatic Hyperplasia An enlarged prostate (benign prostatic hyperplasia) is common in older men. You may experience the following:  Weak urine stream.  Dribbling.  Feeling like the bladder has not emptied completely.  Difficulty starting urination.  Getting up frequently at night to urinate.  Urinating more frequently during the day. HOME CARE INSTRUCTIONS  Monitor your prostatic hyperplasia for any changes. The following actions may help to alleviate any discomfort you are experiencing:  Give yourself time when you urinate.  Stay away from alcohol.  Avoid beverages containing caffeine, such as coffee, tea, and colas, because they can make the problem worse.  Avoid decongestants, antihistamines, and some prescription medicines that can make the problem worse.  Follow up with your health care provider for further treatment as recommended. SEEK MEDICAL CARE IF:  You are experiencing progressive difficulty voiding.  Your urine stream is progressively getting narrower.  You are awaking from sleep with the urge to void more frequently.  You are constantly feeling the need to void.  You experience loss of urine, especially in small amounts. SEEK IMMEDIATE MEDICAL CARE IF:   You develop increased pain with urination or are unable to urinate.  You develop severe abdominal pain, vomiting, a high fever, or fainting.  You develop back pain or blood in your urine. MAKE SURE YOU:   Understand these instructions.  Will watch your condition.  Will get help right away if you are not doing well or get worse. Document Released: 01/08/2005 Document Revised: 09/10/2012 Document Reviewed: 06/10/2012 [facility] Patient Information 2015 [place], [place]. This information is not intended to replace advice given to you by your health care provider. Make sure you discuss any questions you have with your health care provider.  Prostate Laser Surgery Prostate laser surgery is a  procedure to eliminate prostate tissue. There are two types of prostate laser surgery: ablation (prostate tissue is melted away) and enucleation (prostate tissue is cut out). LET [facility] CARE PROVIDER KNOW ABOUT:  Any allergies you have.  All medicines you are taking, including vitamins, herbs, eye drops, creams, and over-the-counter medicines.  Previous problems you or members of your family have had with the use of anesthetics.  Any blood disorders you have.  Previous surgeries you have had.  Medical conditions you have. RISKS AND COMPLICATIONS  Generally prostate laser surgery is a safe procedure. However, as with any procedure, problems can occur. Possible problems include:  Bleeding and the need for a blood transfusion.   Urinary tract infection.  Erectile dysfunction.  Narrowing (scar or stricture) of the urethra, which blocks the flow of urine.  Dry ejaculation (semen is not released when you reach sexual climax). BEFORE THE PROCEDURE   If you are on blood thinners, such as warfarin or clopidogrel, or nonprescription pain-relieving medicines, such as naproxen sodium or ibuprofen, you may be asked to stop taking them before the procedure.  Your health care provider may ask you to start taking antibiotic medicines before the procedure as a precaution against a bacterial infection. The procedure will not be performed if your urine is infected.  You should have nothing to eat or drink for at least 8 hours before your procedure, or as suggested by your health care provider. You may have a sip of water to take medications not stopped for the procedure. PROCEDURE  You will be given one of the following:   A medicine that numbs the area (local anesthetic).  A medicine injected into your spine that numbs your body  below the waist (spinal anesthetic). A sedative is usually given with spinal anesthetic so you will be relaxed during the procedure. A viewing scope and  instruments will be placed in a tube that is inserted through your penis, so no incisions will be needed to insert the scope and instruments. Depending on the type of laser used, the prostate tissue will either be vaporized or cut away. The laser beam will coagulate any small bleeding areas. At the end of the surgery, a special tube will be inserted into your bladder to drain the urine from your bladder (urinary catheter). AFTER THE PROCEDURE You will be sent to the recovery room for a short time. In the recovery room, you will receive fluids through an IV tube inserted in one of your veins. Your blood pressure and pulse will be checked frequently to make sure that they stabilize. Once you are eating and drinking fluids appropriately, the IV tube will be removed.  Depending on your specific needs, you may be admitted to the hospital or you will be sent home after the procedure. If you are sent home:  You may be sent home with elastic support stockings to help prevent blood clots in your legs.  You will also probably be given an antibiotic medicine.  Unless told otherwise, you may restart your other medications.  You may be given a stool softener. Document Released: 01/08/2005 Document Revised: 01/13/2013 Document Reviewed: 06/30/2012 [facility] Patient Information 2015 [place], [place]. This information is not intended to replace advice given to you by your health care provider. Make sure you discuss any questions you have with your health care provider.  Julianne Rice Laser Prostate Treatment Green light laser therapy is a procedure that uses a special high-energy laser for vaporizing extra prostate tissue. It is less invasive than traditional methods of prostate surgery, which involve cutting out the prostate tissue. Because the tissue is vaporized rather than cut out there is generally less blood loss. LET [facility] CARE PROVIDER KNOW ABOUT:  Any allergies you have.  Any medicines you are  taking, including vitamins, herbs, eye drops, creams, and over-the-counter medication.  Previous problems you or members of your family have had with the use of anesthetics.  Any blood disorders you have.  Previous surgeries you have had.  Medical conditions you have. RISKS AND COMPLICATIONS Generally, green light laser prostate treatment is a safe procedure. However, as with any procedure, complications can occur. Possible complications include:  Urinary tract infection.  Erectile dysfunction (rare).  Dry ejaculation--Semen is not released when you reach sexual climax.  Scar tissue in the urinary passage. BEFORE THE PROCEDURE   Your health care provider may discuss medicines you are taking and may advise you to stop taking specific ones.  You may be given antibiotic medicine to take as a precaution against bacterial infection.  Do not eat or drink anything for 8 hours before your procedure or as directed by your health care provider. You may have a sip of water to take any necessary medicines. PROCEDURE Depending on the size and shape of your prostate, the procedure may take 30-60 minutes. You will be given one of the following:   A medicine that makes you go to sleep (general anesthetic).  A medicine injected into your spine that numbs your body below the waist (spinal anesthetic). Sedation is usually given with spinal anesthetic so you will be relaxed. A tube containing viewing scopes and instruments will be inserted through your penis so that no cuts (  incisions) are needed. A thin fiber is put through the tube and positioned next to the excess prostate tissue. Pulses of laser light come from the end of the fiber and are projected onto the excess tissue. The laser beam is absorbed by your blood, which becomes hot enough to vaporize the excess prostate tissue. This laser beam will seal off the blood vessels, decreasing bleeding. The tube with the viewing scopes, instruments, and  thin fiber will be removed and replaced with a temporary catheter. AFTER THE PROCEDURE  After the surgery, you will be sent to the recovery room for a short time. Depending on factors such as the amount of prostate tissue vaporized, the strength of your bladder, and the amount of bleeding expected, the catheter may be removed. Generally, overnight stay is not needed and you will be sent home on the same day as the procedure. You may be sent home with elastic support stockings to help prevent blood clots in your legs.  Document Released: 04/17/2007 Document Revised: 01/13/2013 Document Reviewed: 06/30/2012 [facility] Patient Information 2015 [place], [place]. This information is not intended to replace advice given to you by your health care provider. Make sure you discuss any questions you have with your health care provider.     AMBULATORY SURGERY  DISCHARGE INSTRUCTIONS   1) The drugs that you were given will stay in your system until tomorrow so for the next 24 hours you should not:  A) Drive an automobile B) Make any legal decisions C) Drink any alcoholic beverage   2) You may resume regular meals tomorrow.  Today it is better to start with liquids and gradually work up to solid foods.  You may eat anything you prefer, but it is better to start with liquids, then soup and crackers, and gradually work up to solid foods.   3) Please notify your doctor immediately if you have any unusual bleeding, trouble breathing, redness and pain at the surgery site, drainage, fever, or pain not relieved by medication. 4)   5) Your post-operative visit with Dr.                                     is: Date:                        Time:    Please call to schedule your post-operative visit.  6) Additional Instructions: 7)

## 2014-09-21 NOTE — Transfer of Care (Signed)
Immediate Anesthesia Transfer of Care Note  Patient: Martin Lopez  Procedure(s) Performed: Procedure(s): GREEN LIGHT LASER TURP (TRANSURETHRAL RESECTION OF PROSTATE (N/A)  Patient Location: PACU  Anesthesia Type:General  Level of Consciousness: awake and patient cooperative  Airway & Oxygen Therapy: Patient Spontanous Breathing and Patient connected to face mask oxygen  Post-op Assessment: Report given to RN  Post vital signs: Reviewed and stable  Last Vitals:  Filed Vitals:   09/21/14 1407  BP: 135/86  Pulse: 69  Temp: 36.3 C  Resp:     Complications: No apparent anesthesia complications

## 2014-09-21 NOTE — Anesthesia Preprocedure Evaluation (Addendum)
Anesthesia Evaluation  Patient identified by MRN, date of birth, ID band Patient awake    Reviewed: Allergy & Precautions, NPO status , Patient's Chart, lab work & pertinent test results  Airway Mallampati: II  TM Distance: >3 FB Neck ROM: Full    Dental no notable dental hx.    Pulmonary shortness of breath and with exertion,    Pulmonary exam normal       Cardiovascular hypertension, Normal cardiovascular exam    Neuro/Psych negative neurological ROS  negative psych ROS   GI/Hepatic Neg liver ROS, GERD-  Medicated and Controlled,  Endo/Other  negative endocrine ROS  Renal/GU negative Renal ROS  negative genitourinary   Musculoskeletal negative musculoskeletal ROS (+)   Abdominal Normal abdominal exam  (+)   Peds negative pediatric ROS (+)  Hematology negative hematology ROS (+)   Anesthesia Other Findings   Reproductive/Obstetrics                            Anesthesia Physical Anesthesia Plan  ASA: II  Anesthesia Plan: General   Post-op Pain Management:    Induction: Intravenous  Airway Management Planned: LMA  Additional Equipment:   Intra-op Plan:   Post-operative Plan: Extubation in OR  Informed Consent: I have reviewed the patients History and Physical, chart, labs and discussed the procedure including the risks, benefits and alternatives for the proposed anesthesia with the patient or authorized representative who has indicated his/her understanding and acceptance.   Dental advisory given  Plan Discussed with: CRNA and Surgeon  Anesthesia Plan Comments:        Anesthesia Quick Evaluation

## 2014-09-23 ENCOUNTER — Encounter: Payer: Self-pay | Admitting: Urology

## 2014-09-24 NOTE — Anesthesia Postprocedure Evaluation (Signed)
  Anesthesia Post-op Note  Patient: Martin Lopez  Procedure(s) Performed: Procedure(s): GREEN LIGHT LASER TURP (TRANSURETHRAL RESECTION OF PROSTATE (N/A)  Anesthesia type:General  Patient location: PACU  Post pain: Pain level controlled  Post assessment: Post-op Vital signs reviewed, Patient's Cardiovascular Status Stable, Respiratory Function Stable, Patent Airway and No signs of Nausea or vomiting  Post vital signs: Reviewed and stable  Last Vitals:  Filed Vitals:   09/21/14 1521  BP: 129/78  Pulse: 53  Temp:   Resp: 20    Level of consciousness: awake, alert  and patient cooperative  Complications: No apparent anesthesia complications

## 2015-04-06 ENCOUNTER — Ambulatory Visit: Payer: Medicare Other | Admitting: Cardiovascular Disease

## 2015-04-19 ENCOUNTER — Telehealth: Payer: Self-pay | Admitting: Cardiovascular Disease

## 2015-04-19 NOTE — Telephone Encounter (Signed)
Spoke w/ pt.  He reports that for the past month, he has had a persistent weakness in his left arm.  Pt denies chest pain, SOB, n/v, diaphoresis, HA, assymmetry or weakness anywhere else but his left arm. He does have an issue w/ his wrist, unsure if it's carpal tunnel or tendonitis. He reports reduced grip strength, does have some balance issues. Pt does have a pinched nerve that has bothered him for about 8 or 9 years. Advised him that I am placing him on my waiting list to call in the event of a cancellation.  Recommended he speak w/ his PCP, as well about his sx.

## 2015-04-19 NOTE — Telephone Encounter (Signed)
Pt states for the past 2 weeks he has felt a "sensation" in his left arm, weakness in left arm, and a balance issue. Denies SOB, chest pain, or tightness. Please call.

## 2015-04-20 ENCOUNTER — Ambulatory Visit (INDEPENDENT_AMBULATORY_CARE_PROVIDER_SITE_OTHER): Payer: Medicare Other | Admitting: Cardiovascular Disease

## 2015-04-20 ENCOUNTER — Encounter (INDEPENDENT_AMBULATORY_CARE_PROVIDER_SITE_OTHER): Payer: Self-pay

## 2015-04-20 ENCOUNTER — Encounter: Payer: Self-pay | Admitting: Cardiovascular Disease

## 2015-04-20 VITALS — BP 142/86 | HR 56 | Ht 68.0 in | Wt 167.0 lb

## 2015-04-20 DIAGNOSIS — E785 Hyperlipidemia, unspecified: Secondary | ICD-10-CM | POA: Diagnosis not present

## 2015-04-20 DIAGNOSIS — I10 Essential (primary) hypertension: Secondary | ICD-10-CM | POA: Diagnosis not present

## 2015-04-20 DIAGNOSIS — R29898 Other symptoms and signs involving the musculoskeletal system: Secondary | ICD-10-CM | POA: Insufficient documentation

## 2015-04-20 NOTE — Assessment & Plan Note (Signed)
Cholesterol is at goal on the current lipid regimen. No changes to the medications were made.  

## 2015-04-20 NOTE — Assessment & Plan Note (Signed)
Etiology of his left arm weakness is unclear, possibly orthopedic. Low likelihood of cardiac etiology Suggested icing, NSAIDs, rest. If symptoms persist, would discuss with orthopedics

## 2015-04-20 NOTE — Assessment & Plan Note (Signed)
Blood pressure is well controlled on today's visit. No changes made to the medications. 

## 2015-04-20 NOTE — Progress Notes (Signed)
Patient ID: Martin Lopez, male    DOB: [birth year], 70 y.o.   MRN: [id number]  HPI Comments: Martin Lopez is a very pleasant 70 year-old gentleman, patient of Dr. Hall Busing,  with a history of hypertension, hyperlipidemia, strong family history of coronary artery disease who presents  for routine followup of his hypertension and hyperlipidemia.  Prior cardiac calcium score in 2009 of  Zero   In follow-up he reports doing well but is bothered byleft wrist pain, left elbow, left shoulder discomfort at times, also a feeling of weakness in his left arm. Previously seen by orthopedics, referred to Dr. Annette Stable in [place].  Continues to walk on a treadmill daily at home Reports he has walked for 2279 days daily straight without missing any days Denies any chest pain or shortness of breath concerning for angina  Lab work reviewed with him showing total cholesterol 167, LDL 74, hemoglobin A1c 5.9  EKG on today's visit shows normal sinus rhythm with rate 56 bpm, no significant ST or T-wave changes  CT scan of the chest reviewed with him showing no significant coronary calcifications, no significant plaque in the aorta   No Known Allergies  Outpatient Encounter Prescriptions as of 04/20/2015  Medication Sig  . esomeprazole (NEXIUM) 40 MG capsule Take 40 mg by mouth daily before breakfast.  . irbesartan (AVAPRO) 300 MG tablet TAKE 1 TABLET DAILY (Patient taking differently: TAKE 1 TABLET IN AM)  . Multiple Vitamin (MULTIVITAMIN) capsule Take 1 capsule by mouth daily.  . rosuvastatin (CRESTOR) 10 MG tablet Take 5 mg by mouth every other day.   . valACYclovir (VALTREX) 1000 MG tablet Take 1,000 mg by mouth as needed.   . zolpidem (AMBIEN) 5 MG tablet Take 5 mg by mouth at bedtime as needed. Pt takes 1/2 tablet each night  . [DISCONTINUED] docusate sodium (COLACE) 100 MG capsule Take 2 capsules (200 mg total) by mouth 2 (two) times daily. (Patient not taking: Reported on 04/20/2015)  . [DISCONTINUED]  levofloxacin (LEVAQUIN) 500 MG tablet Take 1 tablet (500 mg total) by mouth daily. (Patient not taking: Reported on 04/20/2015)  . [DISCONTINUED] Meth-Hyo-M Bl-Na Phos-Ph Sal (URIBEL) 118 MG CAPS Take 1 capsule (118 mg total) by mouth every 6 (six) hours as needed (dysuria). (Patient not taking: Reported on 04/20/2015)  . [DISCONTINUED] NUCYNTA 50 MG TABS tablet Take 1 tablet (50 mg total) by mouth every 6 (six) hours as needed for moderate pain. 1 TO 2 TABS Q 6 HOURS PRN PAIN (Patient not taking: Reported on 04/20/2015)  . [DISCONTINUED] triamcinolone cream (KENALOG) 0.1 % Apply 1 application topically as needed. Reported on 04/20/2015   No facility-administered encounter medications on file as of 04/20/2015.    Past Medical History  Diagnosis Date  . HTN (hypertension)   . HLD (hyperlipidemia)   . Bulging disc 2013  . Bone spur     kneck  . Allergy   . Cancer (HCC)     basal cell ca - bil, bil legs, scalp, face  . GERD (gastroesophageal reflux disease)     Past Surgical History  Procedure Laterality Date  . Basal cell carcinoma excision  2013    right leg,FOREHEAD  . Tonsillectomy    . Colonoscopy    . Green light laser turp (transurethral resection of prostate N/A 09/21/2014    Procedure: GREEN LIGHT LASER TURP (TRANSURETHRAL RESECTION OF PROSTATE;  Surgeon: [name], MD;  Location: [facility] ORS;  Service: Urology;  Laterality: N/A;  . Hemorrhoid surgery  Social History  reports that he has never smoked. He has never used smokeless tobacco. He reports that he drinks about 0.6 oz of alcohol per week. He reports that he does not use illicit drugs.  Family History family history includes Coronary artery disease in his maternal uncle; Ovarian cancer in his mother. There is no history of Colon cancer, Esophageal cancer, Gallbladder disease, Rectal cancer, or Stomach cancer.  Review of Systems  Constitutional: Negative.   Respiratory: Negative.   Cardiovascular: Negative.    Gastrointestinal: Negative.   Musculoskeletal: Negative.   Neurological: Negative.   Hematological: Negative.   Psychiatric/Behavioral: Negative.   All other systems reviewed and are negative.   BP 142/86 mmHg  Pulse 56  Ht 5\' 8"  (1.727 m)  Wt 167 lb (75.751 kg)  BMI 25.40 kg/m2  Physical Exam  Constitutional: He is oriented to person, place, and time. He appears well-developed and well-nourished.  HENT:  Head: Normocephalic.  Nose: Nose normal.  Mouth/Throat: Oropharynx is clear and moist.  Eyes: Conjunctivae are normal. Pupils are equal, round, and reactive to light.  Neck: Normal range of motion. Neck supple. No JVD present.  Cardiovascular: Normal rate, regular rhythm, S1 normal, S2 normal, normal heart sounds and intact distal pulses.  Exam reveals no gallop and no friction rub.   No murmur heard. Pulmonary/Chest: Effort normal and breath sounds normal. No respiratory distress. He has no wheezes. He has no rales. He exhibits no tenderness.  Abdominal: Soft. Bowel sounds are normal. He exhibits no distension. There is no tenderness.  Musculoskeletal: Normal range of motion. He exhibits no edema or tenderness.  Lymphadenopathy:    He has no cervical adenopathy.  Neurological: He is alert and oriented to person, place, and time. Coordination normal.  Skin: Skin is warm and dry. No rash noted. No erythema.  Psychiatric: He has a normal mood and affect. His behavior is normal. Judgment and thought content normal.      Assessment and Plan   Nursing note and vitals reviewed.

## 2015-04-20 NOTE — Patient Instructions (Signed)
You are doing well. No medication changes were made.  Please call us if you have new issues that need to be addressed before your next appt.  Your physician wants you to follow-up in: 12 months.  You will receive a reminder letter in the mail two months in advance. If you don't receive a letter, please call our office to schedule the follow-up appointment. 

## 2015-05-04 ENCOUNTER — Ambulatory Visit: Payer: Medicare Other | Admitting: Cardiovascular Disease

## 2015-06-21 ENCOUNTER — Other Ambulatory Visit: Payer: Self-pay | Admitting: Cardiovascular Disease

## 2015-06-22 ENCOUNTER — Other Ambulatory Visit: Payer: Self-pay | Admitting: *Deleted

## 2015-06-22 MED ORDER — IRBESARTAN 300 MG PO TABS: 300.0000 mg | ORAL_TABLET | Freq: Every day | ORAL | Status: DC

## 2016-02-02 NOTE — Telephone Encounter (Signed)
This encounter was created in error - please disregard.

## 2016-02-10 ENCOUNTER — Ambulatory Visit (INDEPENDENT_AMBULATORY_CARE_PROVIDER_SITE_OTHER): Payer: Medicare Other | Admitting: General Surgery

## 2016-02-10 ENCOUNTER — Encounter: Payer: Self-pay | Admitting: General Surgery

## 2016-02-10 VITALS — BP 140/86 | HR 79 | Resp 14 | Ht 68.0 in | Wt 174.0 lb

## 2016-02-10 DIAGNOSIS — K649 Unspecified hemorrhoids: Secondary | ICD-10-CM | POA: Diagnosis not present

## 2016-02-10 NOTE — Progress Notes (Signed)
Patient ID: Martin Lopez, male   DOB: November 18, 1945, 71 y.o.   MRN: [id number]  Chief Complaint  Patient presents with  . Other    hemorrhoids    HPI Martin Lopez is a 71 y.o. male here for evaluation of hemorrhoids. He has had problems with hemorrhoids since April of 2016. He was diagnosed with internal hemorrhoids. He has been using creams for this and the problem seems to be getting worse. He has bleeding off and on. He has ongoing minor discomfort. HPI  Past Medical History:  Diagnosis Date  . Allergy   . Bone spur    kneck  . Bulging disc 2013  . Cancer (HCC)    basal cell ca - bil, bil legs, scalp, face  . GERD (gastroesophageal reflux disease)   . HLD (hyperlipidemia)   . HTN (hypertension)     Past Surgical History:  Procedure Laterality Date  . BASAL CELL CARCINOMA EXCISION  2013   right leg,FOREHEAD  . COLONOSCOPY  2016  . COLONOSCOPY    . GREEN LIGHT LASER TURP (TRANSURETHRAL RESECTION OF PROSTATE N/A 09/21/2014   Procedure: GREEN LIGHT LASER TURP (TRANSURETHRAL RESECTION OF PROSTATE;  Surgeon: [name], MD;  Location: [facility] ORS;  Service: Urology;  Laterality: N/A;  . TONSILLECTOMY      Family History  Problem Relation Age of Onset  . Ovarian cancer Mother   . Cancer Father     [name]  . Coronary artery disease Maternal Uncle     x2  . Colon cancer Neg Hx   . Esophageal cancer Neg Hx   . Gallbladder disease Neg Hx   . Rectal cancer Neg Hx   . Stomach cancer Neg Hx     Social History Social History  Substance Use Topics  . Smoking status: Never Smoker  . Smokeless tobacco: Never Used     Comment: tobacco use- no   . Alcohol use 0.6 oz/week    1 Standard drinks or equivalent per week     Comment: 2 mixed drink a day    No Known Allergies  Current Outpatient Prescriptions  Medication Sig Dispense Refill  . amLODipine (NORVASC) 5 MG tablet Take 1 tablet (5 mg total) by mouth daily. 180 tablet 3  . esomeprazole (NEXIUM) 40 MG  capsule Take 40 mg by mouth daily before breakfast.    . irbesartan (AVAPRO) 300 MG tablet Take 1 tablet (300 mg total) by mouth daily. 90 tablet 3  . rosuvastatin (CRESTOR) 10 MG tablet Take 5 mg by mouth every other day.     . terazosin (HYTRIN) 10 MG capsule Take 1 capsule (10 mg total) by mouth at bedtime.    . valACYclovir (VALTREX) 1000 MG tablet Take 1,000 mg by mouth as needed.   1  . zolpidem (AMBIEN) 5 MG tablet Take 5 mg by mouth at bedtime as needed. Pt takes 1/2 tablet each night    . Multiple Vitamin (MULTIVITAMIN) capsule Take 1 capsule by mouth daily.     No current facility-administered medications for this visit.     Review of Systems Review of Systems  Constitutional: Negative.   Respiratory: Negative.   Cardiovascular: Negative.   Gastrointestinal: Negative for abdominal distention, abdominal pain, anal bleeding, blood in stool, constipation, diarrhea, nausea, rectal pain and vomiting.    Blood pressure 140/86, pulse 79, resp. rate 14, height 5\' 8"  (1.727 m), weight 174 lb (78.9 kg).  Physical Exam Physical Exam  Constitutional: He is oriented to  person, place, and time. He appears well-developed and well-nourished.  Eyes: Conjunctivae are normal. No scleral icterus.  Neck: Neck supple.  Cardiovascular: Normal rate, regular rhythm and normal heart sounds.   Pulmonary/Chest: Effort normal and breath sounds normal.  Abdominal: Soft. Bowel sounds are normal. He exhibits no distension and no mass. There is no tenderness.  Genitourinary: Rectal exam shows external hemorrhoid and internal hemorrhoid. Rectal exam shows no fissure and no tenderness.     Neurological: He is alert and oriented to person, place, and time.  Skin: Skin is warm and dry.    Data Reviewed Colonoscopy from 2016-normal, some internal hemorrhoid  Assessment    External/internal hemorrhoid, single column, symptomatic.    Plan    Discussed options with pt. Hemorrhoidectomy  Is reasonable  given ongoing symptoms for over 2 yrs. He is agreeable.   Procedure. Risks and benefits explained and he is agreeable. This has been scribed by Lesly Rubenstein LPN   [name] 02/10/2016, 12:34 PM

## 2016-02-13 ENCOUNTER — Encounter: Payer: Self-pay | Admitting: *Deleted

## 2016-02-13 NOTE — Progress Notes (Signed)
Patient's surgery has been scheduled for 02-28-16 at [facility].

## 2016-02-16 ENCOUNTER — Other Ambulatory Visit: Payer: Self-pay | Admitting: General Surgery

## 2016-02-16 DIAGNOSIS — K641 Second degree hemorrhoids: Secondary | ICD-10-CM

## 2016-02-21 NOTE — Patient Instructions (Signed)
  Your procedure is scheduled TD:[id number] Feb. 6 , 2018. Report to Same Day Surgery. To find out your arrival time please call [phone] between 1PM - 3PM on Monday Feb. 5, 2018.  Remember: Instructions that are not followed completely may result in serious medical risk, up to and including death, or upon the discretion of your surgeon and anesthesiologist your surgery may need to be rescheduled.    _x___ 1. Do not eat food or drink liquids after midnight. No gum chewing or hard candies.     _x___ 2. No Alcohol for 24 hours before or after surgery.   ____ 3. Bring all medications with you on the day of surgery if instructed.    __x__ 4. Notify your doctor if there is any change in your medical condition     (cold, fever, infections).    _____ 5. No smoking 24 hours prior to surgery.     Do not wear jewelry, make-up, hairpins, clips or nail polish.  Do not wear lotions, powders, or perfumes.   Do not shave 48 hours prior to surgery. Men may shave face and neck.  Do not bring valuables to the hospital.    [facility] is not responsible for any belongings or valuables.               Contacts, dentures or bridgework may not be worn into surgery.  Leave your suitcase in the car. After surgery it may be brought to your room.  For patients admitted to the hospital, discharge time is determined by your treatment team.   Patients discharged the day of surgery will not be allowed to drive home.    Please read over the following fact sheets that you were given:   [facility] Preparing for Surgery  _x___ Take these medicines the morning of surgery with A SIP OF WATER:    1. amLODipine (NORVASC)  2. esomeprazole ([place])    ____ Fleet Enema (as directed)   ____ Use CHG Soap as directed on instruction sheet  ____ Use inhalers on the day of surgery and bring to hospital day of surgery  ____ Stop metformin 2 days prior to surgery    ____ Take 1/2 of usual insulin dose the night  before surgery and none on the morning of  surgery.   __x__ Stop aspirin as directed by Dr. Jamal Collin, usually 1 week prior to surgery.  _x___ Stop Anti-inflammatories such as Advil, Aleve, Ibuprofen, Motrin, Naproxen, Naprosyn, Goodies powders or aspirin products. OK to take Tylenol.   ____ Stop supplements until after surgery.    ____ Bring C-Pap to the hospital.

## 2016-02-21 NOTE — Pre-Procedure Instructions (Signed)
Met B done 01/25/16 in Epic WNL.

## 2016-02-22 ENCOUNTER — Encounter
Admission: RE | Admit: 2016-02-22 | Discharge: 2016-02-22 | Disposition: A | Discharge status: Home / Self Care | Payer: Medicare Other | Source: Ambulatory Visit | Attending: General Surgery | Admitting: General Surgery

## 2016-02-22 DIAGNOSIS — Z01810 Encounter for preprocedural cardiovascular examination: Secondary | ICD-10-CM | POA: Insufficient documentation

## 2016-02-28 ENCOUNTER — Ambulatory Visit: Payer: Medicare Other | Admitting: Registered Nurse

## 2016-02-28 ENCOUNTER — Encounter: Payer: Self-pay | Admitting: *Deleted

## 2016-02-28 ENCOUNTER — Encounter
Admission: RE | Disposition: A | Discharge status: Home / Self Care | Payer: Self-pay | Source: Ambulatory Visit | Attending: General Surgery

## 2016-02-28 ENCOUNTER — Ambulatory Visit
Admission: RE | Admit: 2016-02-28 | Discharge: 2016-02-28 | Disposition: A | Discharge status: Home / Self Care | Payer: Medicare Other | Source: Ambulatory Visit | Attending: General Surgery | Admitting: General Surgery

## 2016-02-28 DIAGNOSIS — Z7982 Long term (current) use of aspirin: Secondary | ICD-10-CM | POA: Insufficient documentation

## 2016-02-28 DIAGNOSIS — K641 Second degree hemorrhoids: Secondary | ICD-10-CM

## 2016-02-28 DIAGNOSIS — K643 Fourth degree hemorrhoids: Secondary | ICD-10-CM | POA: Diagnosis not present

## 2016-02-28 DIAGNOSIS — E785 Hyperlipidemia, unspecified: Secondary | ICD-10-CM | POA: Insufficient documentation

## 2016-02-28 DIAGNOSIS — K219 Gastro-esophageal reflux disease without esophagitis: Secondary | ICD-10-CM | POA: Diagnosis not present

## 2016-02-28 DIAGNOSIS — Z85828 Personal history of other malignant neoplasm of skin: Secondary | ICD-10-CM | POA: Insufficient documentation

## 2016-02-28 DIAGNOSIS — K648 Other hemorrhoids: Secondary | ICD-10-CM | POA: Diagnosis not present

## 2016-02-28 DIAGNOSIS — I10 Essential (primary) hypertension: Secondary | ICD-10-CM | POA: Diagnosis not present

## 2016-02-28 DIAGNOSIS — K644 Residual hemorrhoidal skin tags: Secondary | ICD-10-CM | POA: Diagnosis not present

## 2016-02-28 DIAGNOSIS — Z79899 Other long term (current) drug therapy: Secondary | ICD-10-CM | POA: Diagnosis not present

## 2016-02-28 HISTORY — PX: HEMORRHOID SURGERY: SHX153

## 2016-02-28 SURGERY — HEMORRHOIDECTOMY
Anesthesia: General | Wound class: Clean Contaminated

## 2016-02-28 MED ORDER — FENTANYL CITRATE (PF) 100 MCG/2ML IJ SOLN: 25.0000 ug | INTRAMUSCULAR | Status: DC | PRN

## 2016-02-28 MED ORDER — FENTANYL CITRATE (PF) 100 MCG/2ML IJ SOLN
INTRAMUSCULAR | Status: AC
  Filled 2016-02-28: qty 4

## 2016-02-28 MED ORDER — OXYCODONE-ACETAMINOPHEN 5-325 MG PO TABS: 1.0000 | ORAL_TABLET | ORAL | 0 refills | Status: DC | PRN

## 2016-02-28 MED ORDER — FENTANYL CITRATE (PF) 100 MCG/2ML IJ SOLN
INTRAMUSCULAR | Status: DC | PRN
  Administered 2016-02-28: 50 ug via INTRAVENOUS

## 2016-02-28 MED ORDER — FLEET ENEMA 7-19 GM/118ML RE ENEM
1.0000 | ENEMA | Freq: Once | RECTAL | Status: AC
  Administered 2016-02-28: 1 via RECTAL

## 2016-02-28 MED ORDER — GLYCOPYRROLATE 0.2 MG/ML IJ SOLN
INTRAMUSCULAR | Status: DC | PRN
  Administered 2016-02-28: 0.2 mg via INTRAVENOUS

## 2016-02-28 MED ORDER — BACITRACIN ZINC 500 UNIT/GM EX OINT
TOPICAL_OINTMENT | CUTANEOUS | Status: DC | PRN
  Administered 2016-02-28: 1 via TOPICAL

## 2016-02-28 MED ORDER — BUPIVACAINE HCL (PF) 0.5 % IJ SOLN
INTRAMUSCULAR | Status: AC
  Filled 2016-02-28: qty 30

## 2016-02-28 MED ORDER — LIDOCAINE HCL (CARDIAC) 20 MG/ML IV SOLN
INTRAVENOUS | Status: DC | PRN
  Administered 2016-02-28: 80 mg via INTRAVENOUS

## 2016-02-28 MED ORDER — ACETAMINOPHEN 10 MG/ML IV SOLN
INTRAVENOUS | Status: DC | PRN
Start: 2016-02-28 — End: 2016-02-28
  Administered 2016-02-28: 1000 mg via INTRAVENOUS

## 2016-02-28 MED ORDER — MIDAZOLAM HCL 2 MG/2ML IJ SOLN
INTRAMUSCULAR | Status: DC | PRN
  Administered 2016-02-28: 2 mg via INTRAVENOUS

## 2016-02-28 MED ORDER — BUPIVACAINE LIPOSOME 1.3 % IJ SUSP
INTRAMUSCULAR | Status: DC | PRN
  Administered 2016-02-28: 50 mL

## 2016-02-28 MED ORDER — BACITRACIN ZINC 500 UNIT/GM EX OINT
TOPICAL_OINTMENT | CUTANEOUS | Status: AC
  Filled 2016-02-28: qty 28.35

## 2016-02-28 MED ORDER — PROPOFOL 10 MG/ML IV BOLUS
INTRAVENOUS | Status: DC | PRN
  Administered 2016-02-28: 160 mg via INTRAVENOUS

## 2016-02-28 MED ORDER — ONDANSETRON HCL 4 MG/2ML IJ SOLN
INTRAMUSCULAR | Status: DC | PRN
  Administered 2016-02-28: 4 mg via INTRAVENOUS

## 2016-02-28 MED ORDER — ONDANSETRON HCL 4 MG/2ML IJ SOLN: 4.0000 mg | Freq: Once | INTRAMUSCULAR | Status: DC | PRN

## 2016-02-28 MED ORDER — SODIUM CHLORIDE 0.9 % IJ SOLN
INTRAMUSCULAR | Status: AC
  Filled 2016-02-28: qty 50

## 2016-02-28 MED ORDER — DEXAMETHASONE SODIUM PHOSPHATE 10 MG/ML IJ SOLN
INTRAMUSCULAR | Status: AC
  Filled 2016-02-28: qty 1

## 2016-02-28 MED ORDER — MIDAZOLAM HCL 2 MG/2ML IJ SOLN
INTRAMUSCULAR | Status: AC
  Filled 2016-02-28: qty 2

## 2016-02-28 MED ORDER — ACETAMINOPHEN 10 MG/ML IV SOLN
INTRAVENOUS | Status: AC
  Filled 2016-02-28: qty 100

## 2016-02-28 MED ORDER — PHENYLEPHRINE HCL 10 MG/ML IJ SOLN
INTRAMUSCULAR | Status: DC | PRN
  Administered 2016-02-28 (×4): 100 ug via INTRAVENOUS

## 2016-02-28 MED ORDER — DEXAMETHASONE SODIUM PHOSPHATE 10 MG/ML IJ SOLN
INTRAMUSCULAR | Status: DC | PRN
  Administered 2016-02-28: 5 mg via INTRAVENOUS

## 2016-02-28 MED ORDER — GLYCOPYRROLATE 0.2 MG/ML IJ SOLN
INTRAMUSCULAR | Status: AC
  Filled 2016-02-28: qty 1

## 2016-02-28 MED ORDER — LACTATED RINGERS IV SOLN
INTRAVENOUS | Status: DC
  Administered 2016-02-28: 09:00:00 via INTRAVENOUS

## 2016-02-28 MED ORDER — PROPOFOL 10 MG/ML IV BOLUS
INTRAVENOUS | Status: AC
  Filled 2016-02-28: qty 20

## 2016-02-28 MED ORDER — ONDANSETRON HCL 4 MG/2ML IJ SOLN
INTRAMUSCULAR | Status: AC
  Filled 2016-02-28: qty 2

## 2016-02-28 MED ORDER — BUPIVACAINE LIPOSOME 1.3 % IJ SUSP
INTRAMUSCULAR | Status: AC
  Filled 2016-02-28: qty 20

## 2016-02-28 MED ORDER — LIDOCAINE HCL (PF) 2 % IJ SOLN
INTRAMUSCULAR | Status: AC
  Filled 2016-02-28: qty 2

## 2016-02-28 SURGICAL SUPPLY — 27 items
BLADE SURG 15 STRL SS SAFETY (BLADE) ×2 IMPLANT
BRIEF STRETCH MATERNITY 2XLG (MISCELLANEOUS) ×2 IMPLANT
CANISTER SUCT 1200ML W/VALVE (MISCELLANEOUS) ×2 IMPLANT
DRAPE LAPAROTOMY 77X122 PED (DRAPES) ×2 IMPLANT
DRAPE LEGGINS SURG 28X43 STRL (DRAPES) ×2 IMPLANT
DRAPE UNDER BUTTOCK W/FLU (DRAPES) ×2 IMPLANT
ELECT REM PT RETURN 9FT ADLT (ELECTROSURGICAL) ×2
ELECTRODE REM PT RTRN 9FT ADLT (ELECTROSURGICAL) ×1 IMPLANT
GLOVE BIO SURGEON STRL SZ7 (GLOVE) ×7 IMPLANT
GOWN STRL REUS W/ TWL LRG LVL3 (GOWN DISPOSABLE) ×2 IMPLANT
GOWN STRL REUS W/TWL LRG LVL3 (GOWN DISPOSABLE) ×6
KIT RM TURNOVER STRD PROC AR (KITS) ×2 IMPLANT
LABEL OR SOLS (LABEL) ×2 IMPLANT
LIGASURE IMPACT 36 18CM CVD LR (INSTRUMENTS) ×2 IMPLANT
NDL HYPO 25X1 1.5 SAFETY (NEEDLE) ×1 IMPLANT
NEEDLE HYPO 22GX1.5 SAFETY (NEEDLE) ×1 IMPLANT
NEEDLE HYPO 25X1 1.5 SAFETY (NEEDLE) ×2 IMPLANT
NS IRRIG 500ML POUR BTL (IV SOLUTION) ×2 IMPLANT
PACK BASIN MINOR ARMC (MISCELLANEOUS) ×2 IMPLANT
PAD OB MATERNITY 4.3X12.25 (PERSONAL CARE ITEMS) ×2 IMPLANT
PAD PREP 24X41 OB/GYN DISP (PERSONAL CARE ITEMS) ×2 IMPLANT
SOL PREP PVP 2OZ (MISCELLANEOUS) ×2
SOLUTION PREP PVP 2OZ (MISCELLANEOUS) ×1 IMPLANT
SURGILUBE 2OZ TUBE FLIPTOP (MISCELLANEOUS) ×2 IMPLANT
SUT MNCRL 3-0 UNDYED SH (SUTURE) ×1 IMPLANT
SUT MONOCRYL 3-0 UNDYED (SUTURE) ×1
SYR CONTROL 10ML (SYRINGE) ×3 IMPLANT

## 2016-02-28 NOTE — H&P (View-Only) (Signed)
Patient ID: Martin Lopez, male   DOB: Sep 20, 1945, 71 y.o.   MRN: [id number]  Chief Complaint  Patient presents with  . Other    hemorrhoids    HPI [name] is a 71 y.o. male here for evaluation of hemorrhoids. He has had problems with hemorrhoids since April of 2016. He was diagnosed with internal hemorrhoids. He has been using creams for this and the problem seems to be getting worse. He has bleeding off and on. He has ongoing minor discomfort. HPI  Past Medical History:  Diagnosis Date  . Allergy   . Bone spur    kneck  . Bulging disc 2013  . Cancer (HCC)    basal cell ca - bil, bil legs, scalp, face  . GERD (gastroesophageal reflux disease)   . HLD (hyperlipidemia)   . HTN (hypertension)     Past Surgical History:  Procedure Laterality Date  . BASAL CELL CARCINOMA EXCISION  2013   right leg,FOREHEAD  . COLONOSCOPY  2016  . COLONOSCOPY    . GREEN LIGHT LASER TURP (TRANSURETHRAL RESECTION OF PROSTATE N/A 09/21/2014   Procedure: GREEN LIGHT LASER TURP (TRANSURETHRAL RESECTION OF PROSTATE;  Surgeon: [name], MD;  Location: [facility] ORS;  Service: Urology;  Laterality: N/A;  . TONSILLECTOMY      Family History  Problem Relation Age of Onset  . Ovarian cancer Mother   . Cancer Father     Martin Lopez  . Coronary artery disease Maternal Uncle     x2  . Colon cancer Neg Hx   . Esophageal cancer Neg Hx   . Gallbladder disease Neg Hx   . Rectal cancer Neg Hx   . Stomach cancer Neg Hx     Social History Social History  Substance Use Topics  . Smoking status: Never Smoker  . Smokeless tobacco: Never Used     Comment: tobacco use- no   . Alcohol use 0.6 oz/week    1 Standard drinks or equivalent per week     Comment: 2 mixed drink a day    No Known Allergies  Current Outpatient Prescriptions  Medication Sig Dispense Refill  . amLODipine (NORVASC) 5 MG tablet Take 1 tablet (5 mg total) by mouth daily. 180 tablet 3  . esomeprazole (NEXIUM) 40 MG  capsule Take 40 mg by mouth daily before breakfast.    . irbesartan (AVAPRO) 300 MG tablet Take 1 tablet (300 mg total) by mouth daily. 90 tablet 3  . rosuvastatin (CRESTOR) 10 MG tablet Take 5 mg by mouth every other day.     . terazosin (HYTRIN) 10 MG capsule Take 1 capsule (10 mg total) by mouth at bedtime.    . valACYclovir (VALTREX) 1000 MG tablet Take 1,000 mg by mouth as needed.   1  . zolpidem (AMBIEN) 5 MG tablet Take 5 mg by mouth at bedtime as needed. Pt takes 1/2 tablet each night    . Multiple Vitamin (MULTIVITAMIN) capsule Take 1 capsule by mouth daily.     No current facility-administered medications for this visit.     Review of Systems Review of Systems  Constitutional: Negative.   Respiratory: Negative.   Cardiovascular: Negative.   Gastrointestinal: Negative for abdominal distention, abdominal pain, anal bleeding, blood in stool, constipation, diarrhea, nausea, rectal pain and vomiting.    Blood pressure 140/86, pulse 79, resp. rate 14, height 5\' 8"  (1.727 m), weight 174 lb (78.9 kg).  Physical Exam Physical Exam  Constitutional: He is oriented to  person, place, and time. He appears well-developed and well-nourished.  Eyes: Conjunctivae are normal. No scleral icterus.  Neck: Neck supple.  Cardiovascular: Normal rate, regular rhythm and normal heart sounds.   Pulmonary/Chest: Effort normal and breath sounds normal.  Abdominal: Soft. Bowel sounds are normal. He exhibits no distension and no mass. There is no tenderness.  Genitourinary: Rectal exam shows external hemorrhoid and internal hemorrhoid. Rectal exam shows no fissure and no tenderness.     Neurological: He is alert and oriented to person, place, and time.  Skin: Skin is warm and dry.    Data Reviewed Colonoscopy from 2016-normal, some internal hemorrhoid  Assessment    External/internal hemorrhoid, single column, symptomatic.    Plan    Discussed options with pt. Hemorrhoidectomy  Is reasonable  given ongoing symptoms for over 2 yrs. He is agreeable.   Procedure. Risks and benefits explained and he is agreeable. This has been scribed by Lesly Rubenstein LPN   [name] 02/10/2016, 12:34 PM

## 2016-02-28 NOTE — Anesthesia Preprocedure Evaluation (Signed)
Anesthesia Evaluation  Patient identified by MRN, date of birth, ID band Patient awake    Reviewed: Allergy & Precautions, H&P , NPO status , Patient's Chart, lab work & pertinent test results, reviewed documented beta blocker date and time   Airway Mallampati: II  TM Distance: >3 FB Neck ROM: full    Dental  (+) Teeth Intact   Pulmonary neg pulmonary ROS, shortness of breath,    Pulmonary exam normal        Cardiovascular Exercise Tolerance: Good hypertension, negative cardio ROS Normal cardiovascular exam Rhythm:regular Rate:Normal     Neuro/Psych negative neurological ROS  negative psych ROS   GI/Hepatic negative GI ROS, Neg liver ROS, GERD  ,  Endo/Other  negative endocrine ROS  Renal/GU negative Renal ROS  negative genitourinary   Musculoskeletal   Abdominal   Peds  Hematology negative hematology ROS (+)   Anesthesia Other Findings Past Medical History: No date: Allergy No date: Bone spur     Comment: kneck 2013: Bulging disc No date: Cancer (Door)     Comment: basal cell ca - bil, bil legs, scalp, face No date: GERD (gastroesophageal reflux disease) No date: HLD (hyperlipidemia) No date: HTN (hypertension) Past Surgical History: 2013: BASAL CELL CARCINOMA EXCISION     Comment: right leg,FOREHEAD 2016: COLONOSCOPY No date: COLONOSCOPY 09/21/2014: GREEN LIGHT LASER TURP (TRANSURETHRAL RESECTIO* N/A     Comment: Procedure: GREEN LIGHT LASER TURP               (TRANSURETHRAL RESECTION OF PROSTATE;  Surgeon:              [name], MD;  Location: [facility] ORS;                Service: Urology;  Laterality: N/A; No date: TONSILLECTOMY BMI    Body Mass Index:  26.61 kg/m     Reproductive/Obstetrics negative OB ROS                             Anesthesia Physical Anesthesia Plan  ASA: II  Anesthesia Plan: General LMA   Post-op Pain Management:    Induction:   Airway  Management Planned:   Additional Equipment:   Intra-op Plan:   Post-operative Plan:   Informed Consent: I have reviewed the patients History and Physical, chart, labs and discussed the procedure including the risks, benefits and alternatives for the proposed anesthesia with the patient or authorized representative who has indicated his/her understanding and acceptance.   Dental Advisory Given  Plan Discussed with: CRNA  Anesthesia Plan Comments:         Anesthesia Quick Evaluation

## 2016-02-28 NOTE — Transfer of Care (Signed)
Immediate Anesthesia Transfer of Care Note  Patient: Martin Lopez  Procedure(s) Performed: Procedure(s): HEMORRHOIDECTOMY (N/A)  Patient Location: PACU  Anesthesia Type:General  Level of Consciousness: awake  Airway & Oxygen Therapy: Patient connected to face mask oxygen  Post-op Assessment: Post -op Vital signs reviewed and stable  Post vital signs: stable  Last Vitals:  Vitals:   02/28/16 0820 02/28/16 0945  BP: (!) 157/97 110/70  Pulse: 82 71  Resp: 18 11  Temp: (!) 35.7 C 36.2 C    Last Pain:  Vitals:   02/28/16 0945  TempSrc: Temporal         Complications: No apparent anesthesia complications

## 2016-02-28 NOTE — Anesthesia Post-op Follow-up Note (Cosign Needed)
Anesthesia QCDR form completed.        

## 2016-02-28 NOTE — OR Nursing (Signed)
Patient given and instructed on sitz bath use.

## 2016-02-28 NOTE — Op Note (Signed)
Preop diagnosis: Internal/external hemorrhoid  Post op diagnosis: Same  Operation: Hemorrhoidectomy internal and external  Surgeon: Mckinley Jewel  Assistant:     Anesthesia: Gen.  Complications: None  EBL: Minimal  Drains: None  Description: Patient was put to sleep and then placed the lithotomy position the operating table. The anal area was prepped and draped as sterile field Betadine as prep. Timeout was performed. The patient had a very prominent external hemorrhoid with an internal component was markedly irritated located at the 1:00 location. In addition there were a couple was prominent internal hemorrhoids additionally inside the rectum. Speculum examination and digital examination was completed. There was also noted to be couple allow smaller internal hemorrhoids on the right side but they were not symptomatic. 20 mL of Exparel mixed with 25 mL of saline was then instilled in the perianal region and in the intersphincteric groove for postop analgesia. The prominent internal and external hemorrhoid at the 1:00 location was then incised at this base on the skin and then the LigaSure device was utilized to take down this hemorrhoid complex completely. With 2 internal hemorrhoid underwent addition to the ones removed   on the left side were then removed again using the LigaSure device. The open skin area was then partially closed with a single 3-0 Monocryl stitch. The anal canal and the anal skin area was covered with bacitracin ointment and dressed. Patient tolerated the procedure well with no immediate problems encountered. He was subsequently returned recovery room stable condition.

## 2016-02-28 NOTE — Discharge Instructions (Signed)

## 2016-02-28 NOTE — Anesthesia Procedure Notes (Signed)
Procedure Name: LMA Insertion Date/Time: 02/28/2016 9:00 AM Performed by: Doreen Salvage Pre-anesthesia Checklist: Patient identified, Patient being monitored, Timeout performed, Emergency Drugs available and Suction available Patient Re-evaluated:Patient Re-evaluated prior to inductionOxygen Delivery Method: Circle system utilized Preoxygenation: Pre-oxygenation with 100% oxygen Intubation Type: IV induction Ventilation: Mask ventilation without difficulty LMA: LMA inserted LMA Size: 5.0 Tube type: Oral Number of attempts: 1 Placement Confirmation: positive ETCO2 and breath sounds checked- equal and bilateral Tube secured with: Tape Dental Injury: Teeth and Oropharynx as per pre-operative assessment

## 2016-02-28 NOTE — Interval H&P Note (Signed)
History and Physical Interval Note:  02/28/2016 8:36 AM  Martin Lopez  has presented today for surgery, with the diagnosis of hemorrhoids  The various methods of treatment have been discussed with the patient and family. After consideration of risks, benefits and other options for treatment, the patient has consented to  Procedure(s): HEMORRHOIDECTOMY (N/A) as a surgical intervention .  The patient's history has been reviewed, patient examined, no change in status, stable for surgery.  I have reviewed the patient's chart and labs.  Questions were answered to the patient's satisfaction.     Fletcher Ostermiller G

## 2016-02-29 LAB — SURGICAL PATHOLOGY

## 2016-02-29 NOTE — Anesthesia Postprocedure Evaluation (Signed)
Anesthesia Post Note  Patient: Martin Lopez  Procedure(s) Performed: Procedure(s) (LRB): HEMORRHOIDECTOMY (N/A)  Patient location during evaluation: PACU Anesthesia Type: General Level of consciousness: awake and alert Pain management: pain level controlled Vital Signs Assessment: post-procedure vital signs reviewed and stable Respiratory status: spontaneous breathing, nonlabored ventilation, respiratory function stable and patient connected to nasal cannula oxygen Cardiovascular status: blood pressure returned to baseline and stable Postop Assessment: no signs of nausea or vomiting Anesthetic complications: no     Last Vitals:  Vitals:   02/28/16 1033 02/28/16 1056  BP: 136/82 136/79  Pulse: 68 71  Resp: 16 16  Temp: 36.4 C 36.6 C    Last Pain:  Vitals:   02/28/16 1056  TempSrc: Temporal                 Molli Barrows

## 2016-03-08 ENCOUNTER — Ambulatory Visit (INDEPENDENT_AMBULATORY_CARE_PROVIDER_SITE_OTHER): Payer: Medicare Other | Admitting: General Surgery

## 2016-03-08 ENCOUNTER — Encounter: Payer: Self-pay | Admitting: General Surgery

## 2016-03-08 VITALS — BP 150/78 | HR 98 | Resp 14 | Ht 68.0 in | Wt 168.0 lb

## 2016-03-08 DIAGNOSIS — K649 Unspecified hemorrhoids: Secondary | ICD-10-CM

## 2016-03-08 NOTE — Patient Instructions (Signed)
Return  As needed.  

## 2016-03-08 NOTE — Progress Notes (Addendum)
Patient ID: Martin Lopez, male   DOB: May 13, 1945, 71 y.o.   MRN: [id number]  Chief Complaint  Patient presents with  . Routine Post Op    HPI Martin Lopez is a 71 y.o. Lemar Livings today for his post op hemorrhoidectomy done on 2/6/2018He states he is doing well. Bleeding only with bowel movements. I have reviewed the history of present illness with the patient.   Marland KitchenHPI  Past Medical History:  Diagnosis Date  . Allergy   . Bone spur    kneck  . Bulging disc 2013  . Cancer (HCC)    basal cell ca - bil, bil legs, scalp, face  . GERD (gastroesophageal reflux disease)   . HLD (hyperlipidemia)   . HTN (hypertension)     Past Surgical History:  Procedure Laterality Date  . BASAL CELL CARCINOMA EXCISION  2013   right leg,FOREHEAD  . COLONOSCOPY  2016  . COLONOSCOPY    . GREEN LIGHT LASER TURP (TRANSURETHRAL RESECTION OF PROSTATE N/A 09/21/2014   Procedure: GREEN LIGHT LASER TURP (TRANSURETHRAL RESECTION OF PROSTATE;  Surgeon: [name], MD;  Location: [facility] ORS;  Service: Urology;  Laterality: N/A;  . HEMORRHOID SURGERY N/A 02/28/2016   Procedure: HEMORRHOIDECTOMY;  Surgeon: [name], MD;  Location: [facility] ORS;  Service: General;  Laterality: N/A;  . TONSILLECTOMY      Family History  Problem Relation Age of Onset  . Ovarian cancer Mother   . Cancer Father     [name]  . Coronary artery disease Maternal Uncle     x2  . Colon cancer Neg Hx   . Esophageal cancer Neg Hx   . Gallbladder disease Neg Hx   . Rectal cancer Neg Hx   . Stomach cancer Neg Hx     Social History Social History  Substance Use Topics  . Smoking status: Never Smoker  . Smokeless tobacco: Never Used     Comment: tobacco use- no   . Alcohol use 0.6 oz/week    1 Standard drinks or equivalent per week     Comment: 2 mixed drink a day    No Known Allergies  Current Outpatient Prescriptions  Medication Sig Dispense Refill  . amLODipine (NORVASC) 5 MG tablet Take 1 tablet (5 mg  total) by mouth daily. 180 tablet 3  . aspirin EC 81 MG tablet Take 81 mg by mouth daily.    . Emollient (GOLD BOND MEDICATED BODY) 5-0.15 % LOTN Apply 1 application topically 2 (two) times daily.    Marland Kitchen esomeprazole (NEXIUM) 40 MG capsule Take 40 mg by mouth daily before breakfast. Takes 0.5 tablet    . ibuprofen (ADVIL,MOTRIN) 200 MG tablet Take 400 mg by mouth every 6 (six) hours as needed for mild pain or moderate pain.    Marland Kitchen irbesartan (AVAPRO) 300 MG tablet Take 1 tablet (300 mg total) by mouth daily. 90 tablet 3  . Multiple Vitamin (MULTIVITAMIN) capsule Take 1 capsule by mouth daily.    . rosuvastatin (CRESTOR) 10 MG tablet Take 10 mg by mouth every other day.     . terazosin (HYTRIN) 10 MG capsule Take 1 capsule (10 mg total) by mouth at bedtime.    . valACYclovir (VALTREX) 1000 MG tablet Take 1,000 mg by mouth as needed (freezer blisters).   1  . zolpidem (AMBIEN) 5 MG tablet Take 2.5 mg by mouth at bedtime as needed for sleep. Pt takes 1/2 tablet each night     No current facility-administered medications  for this visit.     Review of Systems Review of Systems  Constitutional: Negative.   Respiratory: Negative.   Cardiovascular: Negative.     Blood pressure (!) 150/78, pulse 98, resp. rate 14, height 5\' 8"  (1.727 m), weight 168 lb (76.2 kg).  Physical Exam Physical Exam  Constitutional: He is oriented to person, place, and time. He appears well-developed and well-nourished.  Neurological: He is alert and oriented to person, place, and time.  Skin: Skin is warm and dry.  Hemorrhoidectomy site looks clean and healing well Data Reviewed   Assessment    Post hemorrhoidectomy, doing well.    Plan    Patient to return as needed.  This information has been scribed by Gaspar Cola CMA.       Aldrick Derrig G 03/28/2016, 12:57 PM

## 2016-04-17 NOTE — Progress Notes (Signed)
Cardiology Office Note  Date:  04/19/2016   ID:  Martin, Lopez 23-Jun-1945, MRN [id number]  PCP:  Martin Billet, MD   Chief Complaint  Patient presents with  . Other    1 yr f/u no complaints today. Meds reviewed verbally with pt.    HPI:  Martin Lopez is a very pleasant 71 year-old gentleman, patient of Martin Lopez,  with a history of hypertension,  hyperlipidemia,  strong family history of coronary artery disease  who presents  for routine followup of his hypertension and hyperlipidemia.  Prior cardiac calcium score in 2009 of  Zero   in s/p hemorrhoidectomy  In general he is feeling well Continues to exercise on a daily basis Blood pressure running high today, he does not check this at home 150/100 even on my recheck  Previously seen by orthopedics, referred to Martin Lopez in [place]. Wrist problems   Reports he has walked for 2644 days daily straight without missing any days Denies any chest pain or shortness of breath concerning for angina  Lab work reviewed with him showing total cholesterol 170, LDL 78 with normal LFTs  EKG on today's visit shows normal sinus rhythm with rate 65 bpm, no significant ST or T-wave changes  CT scan of the chest reviewed with him showing no significant coronary calcifications, no significant plaque in the aorta   PMH:   has a past medical history of Allergy; Bone spur; Bulging disc (2013); Cancer (Martin Lopez); GERD (gastroesophageal reflux disease); HLD (hyperlipidemia); and HTN (hypertension).  PSH:    Past Surgical History:  Procedure Laterality Date  . BASAL CELL CARCINOMA EXCISION  2013   right leg,FOREHEAD  . COLONOSCOPY  2016  . COLONOSCOPY    . GREEN LIGHT LASER TURP (TRANSURETHRAL RESECTION OF PROSTATE N/A 09/21/2014   Procedure: GREEN LIGHT LASER TURP (TRANSURETHRAL RESECTION OF PROSTATE;  Surgeon: Martin Cowper, MD;  Location: [facility];  Service: Urology;  Laterality: N/A;  . HEMORRHOID SURGERY N/A 02/28/2016   Procedure:  HEMORRHOIDECTOMY;  Surgeon: Martin Lye, MD;  Location: [facility];  Service: General;  Laterality: N/A;  . TONSILLECTOMY      Current Outpatient Prescriptions  Medication Sig Dispense Refill  . amLODipine (NORVASC) 5 MG tablet Take 1 tablet (5 mg total) by mouth daily. 180 tablet 3  . aspirin EC 81 MG tablet Take 81 mg by mouth daily.    . Emollient (GOLD BOND MEDICATED BODY) 5-0.15 % LOTN Apply 1 application topically 2 (two) times daily.    Marland Kitchen esomeprazole (NEXIUM) 40 MG capsule Take 40 mg by mouth daily before breakfast. Takes 0.5 tablet    . ibuprofen (ADVIL,MOTRIN) 200 MG tablet Take 400 mg by mouth every 6 (six) hours as needed for mild pain or moderate pain.    Marland Kitchen irbesartan (AVAPRO) 300 MG tablet Take 1 tablet (300 mg total) by mouth daily. 90 tablet 3  . Multiple Vitamin (MULTIVITAMIN) capsule Take 1 capsule by mouth daily.    . rosuvastatin (CRESTOR) 10 MG tablet Take 10 mg by mouth every other day.     . terazosin (HYTRIN) 10 MG capsule Take 1 capsule (10 mg total) by mouth at bedtime.    . valACYclovir (VALTREX) 1000 MG tablet Take 1,000 mg by mouth as needed (freezer blisters).   1  . zolpidem (AMBIEN) 5 MG tablet Take 2.5 mg by mouth at bedtime as needed for sleep. Pt takes 1/2 tablet each night     No current facility-administered medications for  this visit.      Allergies:   Patient has no known allergies.   Social History:  The patient  reports that he has never smoked. He has never used smokeless tobacco. He reports that he drinks about 0.6 oz of alcohol per week . He reports that he does not use drugs.   Family History:   family history includes Cancer in his father; Coronary artery disease in his maternal uncle; Ovarian cancer in his mother.    Review of Systems: Review of Systems  Constitutional: Negative.   Respiratory: Negative.   Cardiovascular: Negative.   Gastrointestinal: Negative.   Musculoskeletal: Negative.   Neurological: Negative.    Psychiatric/Behavioral: Negative.   All other systems reviewed and are negative.    PHYSICAL EXAM: VS:  BP (!) 154/100 (BP Location: Left Arm, Patient Position: Sitting, Cuff Size: Normal)   Pulse 65   Ht 5\' 8"  (1.727 m)   Wt 168 lb (76.2 kg)   BMI 25.54 kg/m  , BMI Body mass index is 25.54 kg/m. GEN: Well nourished, well developed, in no acute distress  HEENT: normal  Neck: no JVD, carotid bruits, or masses Cardiac: RRR; no murmurs, rubs, or gallops,no edema  Respiratory:  clear to auscultation bilaterally, normal work of breathing GI: soft, nontender, nondistended, + BS MS: no deformity or atrophy  Skin: warm and dry, no rash Neuro:  Strength and sensation are intact Psych: euthymic mood, full affect    Recent Labs: No results found for requested labs within last 8760 hours.    Lipid Panel No results found for: CHOL, HDL, LDLCALC, TRIG    Wt Readings from Last 3 Encounters:  04/19/16 168 lb (76.2 kg)  03/08/16 168 lb (76.2 kg)  02/28/16 175 lb (79.4 kg)       ASSESSMENT AND PLAN:  HYPERTENSION, BENIGN - Plan: EKG 12-Lead Long discussion concerning his blood pressure Recommended he monitor blood pressure at home We did call in amlodipine 10 mg daily up from 5 mg daily If blood pressure continues to run high suggested he call our office for further medication adjustment  Mixed hyperlipidemia - Plan: EKG 12-Lead CT scan from 2012 reviewed showing minimal aortic plaque, minimal coronary calcification if any. Discussed need for repeat scan, we'll hold off for now is likely little change in the past 6 years We'll continue Crestor If numbers trend higher could add Zetia  Shortness of breath - Plan: EKG 12-Lead Denies any symptoms, exercising on a regular basis   Total encounter time more than 25 minutes  Greater than 50% was spent in counseling and coordination of care with the patient   Disposition:   F/U  12 months   Orders Placed This Encounter   Procedures  . EKG 12-Lead     Signed, Martin Lopez, M.D., Ph.D. 04/19/2016  [place], [place]

## 2016-04-19 ENCOUNTER — Other Ambulatory Visit: Payer: Self-pay | Admitting: Cardiovascular Disease

## 2016-04-19 ENCOUNTER — Ambulatory Visit (INDEPENDENT_AMBULATORY_CARE_PROVIDER_SITE_OTHER): Payer: Medicare Other | Admitting: Cardiovascular Disease

## 2016-04-19 ENCOUNTER — Encounter: Payer: Self-pay | Admitting: Cardiovascular Disease

## 2016-04-19 VITALS — BP 154/100 | HR 65 | Ht 68.0 in | Wt 168.0 lb

## 2016-04-19 DIAGNOSIS — I10 Essential (primary) hypertension: Secondary | ICD-10-CM

## 2016-04-19 DIAGNOSIS — E782 Mixed hyperlipidemia: Secondary | ICD-10-CM

## 2016-04-19 DIAGNOSIS — R0602 Shortness of breath: Secondary | ICD-10-CM | POA: Diagnosis not present

## 2016-04-19 MED ORDER — AMLODIPINE BESYLATE 10 MG PO TABS: 10.0000 mg | ORAL_TABLET | Freq: Every day | ORAL | 3 refills | Status: DC

## 2016-04-19 NOTE — Patient Instructions (Addendum)
Medication Instructions:   Consider increasing the amlodipine up to 10 mg daily Goal <140 on the top, <90 on the bottom  Labwork:  No new labs needed  Testing/Procedures:  No further testing at this time   I recommend watching educational videos on topics of interest to you at:       www.goemmi.com  Enter code: [email or web address]    Follow-Up: It was a pleasure seeing you in the office today. Please call us if you have new issues that need to be addressed before your next appt.  [phone]  Your physician wants you to follow-up in: 12 months.  You will receive a reminder letter in the mail two months in advance. If you don't receive a letter, please call our office to schedule the follow-up appointment.  If you need a refill on your cardiac medications before your next appointment, please call your pharmacy.    Phone

## 2016-06-25 ENCOUNTER — Ambulatory Visit
Admission: RE | Admit: 2016-06-25 | Discharge: 2016-06-25 | Disposition: A | Discharge status: Home / Self Care | Payer: Medicare Other | Source: Ambulatory Visit | Attending: Internal Medicine | Admitting: Internal Medicine

## 2016-06-25 ENCOUNTER — Other Ambulatory Visit: Payer: Self-pay | Admitting: Internal Medicine

## 2016-06-25 DIAGNOSIS — M542 Cervicalgia: Secondary | ICD-10-CM

## 2016-06-25 DIAGNOSIS — R29898 Other symptoms and signs involving the musculoskeletal system: Secondary | ICD-10-CM

## 2016-06-25 DIAGNOSIS — I700 Atherosclerosis of aorta: Secondary | ICD-10-CM | POA: Diagnosis not present

## 2016-06-25 DIAGNOSIS — R05 Cough: Secondary | ICD-10-CM | POA: Diagnosis present

## 2016-06-25 DIAGNOSIS — R918 Other nonspecific abnormal finding of lung field: Secondary | ICD-10-CM | POA: Diagnosis not present

## 2016-06-25 DIAGNOSIS — M50322 Other cervical disc degeneration at C5-C6 level: Secondary | ICD-10-CM | POA: Diagnosis not present

## 2016-06-25 DIAGNOSIS — R059 Cough, unspecified: Secondary | ICD-10-CM

## 2016-06-28 ENCOUNTER — Other Ambulatory Visit: Payer: Self-pay | Admitting: Cardiovascular Disease

## 2016-07-23 ENCOUNTER — Other Ambulatory Visit: Payer: Self-pay | Admitting: Internal Medicine

## 2016-07-23 ENCOUNTER — Ambulatory Visit
Admission: RE | Admit: 2016-07-23 | Discharge: 2016-07-23 | Disposition: A | Discharge status: Home / Self Care | Payer: Medicare Other | Source: Ambulatory Visit | Attending: Internal Medicine | Admitting: Internal Medicine

## 2016-07-23 DIAGNOSIS — I700 Atherosclerosis of aorta: Secondary | ICD-10-CM | POA: Insufficient documentation

## 2016-07-23 DIAGNOSIS — R918 Other nonspecific abnormal finding of lung field: Secondary | ICD-10-CM

## 2016-08-06 NOTE — Addendum Note (Signed)
Encounter addended by: Helene Kelp, RT on: 08/06/2016 12:08 PM<BR>    Actions taken: Imaging Exam begun, Image imported

## 2016-12-18 ENCOUNTER — Ambulatory Visit (INDEPENDENT_AMBULATORY_CARE_PROVIDER_SITE_OTHER): Payer: Medicare Other | Admitting: General Surgery

## 2016-12-18 ENCOUNTER — Encounter: Payer: Self-pay | Admitting: General Surgery

## 2016-12-18 VITALS — BP 148/82 | HR 74 | Resp 12 | Ht 68.0 in | Wt 172.0 lb

## 2016-12-18 DIAGNOSIS — K645 Perianal venous thrombosis: Secondary | ICD-10-CM

## 2016-12-18 NOTE — Progress Notes (Signed)
Patient ID: Martin Lopez, male   DOB: [birth year], 71 y.o.   MRN: [id number]  Chief Complaint  Patient presents with  . Hemorrhoids    HPI Martin Lopez is a 71 y.o. male here today for re-evaluation of rectal bleeding. Patient states he has been bleeding after bowel movements every other day. Denies rectal pain. Has been using OTC hydrocortisone topical treatment. S/p hemorrhoidectomy on 02/28/2016.  HPI  Past Medical History:  Diagnosis Date  . Allergy   . Bone spur    kneck  . Bulging disc 2013  . Cancer (HCC)    basal cell ca - bil, bil legs, scalp, face  . GERD (gastroesophageal reflux disease)   . HLD (hyperlipidemia)   . HTN (hypertension)     Past Surgical History:  Procedure Laterality Date  . BASAL CELL CARCINOMA EXCISION  2013   right leg,FOREHEAD  . COLONOSCOPY  2016  . COLONOSCOPY    . GREEN LIGHT LASER TURP (TRANSURETHRAL RESECTION OF PROSTATE N/A 09/21/2014   Procedure: GREEN LIGHT LASER TURP (TRANSURETHRAL RESECTION OF PROSTATE;  Surgeon: [name], MD;  Location: [facility];  Service: Urology;  Laterality: N/A;  . HEMORRHOID SURGERY N/A 02/28/2016   Procedure: HEMORRHOIDECTOMY;  Surgeon: [name], MD;  Location: [facility];  Service: General;  Laterality: N/A;  . TONSILLECTOMY      Family History  Problem Relation Age of Onset  . Ovarian cancer Mother   . Cancer Father        Martin Lopez  . Coronary artery disease Maternal Uncle        x2  . Colon cancer Neg Hx   . Esophageal cancer Neg Hx   . Gallbladder disease Neg Hx   . Rectal cancer Neg Hx   . Stomach cancer Neg Hx     Social History Social History   Tobacco Use  . Smoking status: Never Smoker  . Smokeless tobacco: Never Used  . Tobacco comment: tobacco use- no   Substance Use Topics  . Alcohol use: Yes    Alcohol/week: 0.6 oz    Types: 1 Standard drinks or equivalent per week    Comment: 2 mixed drink a day  . Drug use: No    No Known Allergies  Current  Outpatient Medications  Medication Sig Dispense Refill  . aspirin EC 81 MG tablet Take 81 mg by mouth daily.    . Emollient (GOLD BOND MEDICATED BODY) 5-0.15 % LOTN Apply 1 application topically 2 (two) times daily.    Marland Kitchen esomeprazole (NEXIUM) 40 MG capsule Take 40 mg by mouth daily before breakfast. Takes 0.5 tablet    . ibuprofen (ADVIL,MOTRIN) 200 MG tablet Take 400 mg by mouth every 6 (six) hours as needed for mild pain or moderate pain.    Marland Kitchen irbesartan (AVAPRO) 300 MG tablet TAKE 1 TABLET DAILY 90 tablet 2  . Multiple Vitamin (MULTIVITAMIN) capsule Take 1 capsule by mouth daily.    . rosuvastatin (CRESTOR) 10 MG tablet Take 10 mg by mouth every other day.     . terazosin (HYTRIN) 10 MG capsule Take 1 capsule (10 mg total) by mouth at bedtime.    Marland Kitchen zolpidem (AMBIEN) 5 MG tablet Take 2.5 mg by mouth at bedtime as needed for sleep. Pt takes 1/2 tablet each night     No current facility-administered medications for this visit.     Review of Systems Review of Systems  Constitutional: Negative.   Respiratory: Negative.   Cardiovascular: Negative.  Blood pressure (!) 148/82, pulse 74, resp. rate 12, height 5\' 8"  (1.727 m), weight 172 lb (78 kg).  Physical Exam Physical Exam  Constitutional: He is oriented to person, place, and time. He appears well-developed and well-nourished.  Pulmonary/Chest: Effort normal.  Genitourinary:     Neurological: He is alert and oriented to person, place, and time.  Skin: Skin is warm and dry.  Psychiatric: He has a normal mood and affect. His behavior is normal.    Data Reviewed Prior notes  Assessment    Small potentially thrombosed external hemorrhoid at 5ocl. Not large or symptomatic enough for surgical intervention at this time. Can try anusol HC.     Plan    Try anusol HC. Return as needed.     HPI, Physical Exam, Assessment and Plan have been scribed under the direction and in the presence of Martin Jewel, MD  Martin Lopez,  CMA   I have completed the exam and reviewed the above documentation for accuracy and completeness.  I agree with the above.  Haematologist has been used and any errors in dictation or transcription are unintentional.  Martin Lopez, M.D., F.A.C.S.  Martin Lopez 12/19/2016, 12:58 PM

## 2016-12-18 NOTE — Patient Instructions (Addendum)
Can try anusol HC. Return as needed.

## 2017-01-18 ENCOUNTER — Other Ambulatory Visit: Payer: Self-pay

## 2017-01-18 MED ORDER — HYDROCORTISONE 2.5 % RE CREA: 1.0000 "application " | TOPICAL_CREAM | Freq: Two times a day (BID) | RECTAL | 0 refills | Status: DC

## 2017-03-11 ENCOUNTER — Other Ambulatory Visit: Payer: Self-pay | Admitting: General Surgery

## 2017-03-11 MED ORDER — HYDROCORTISONE 2.5 % RE CREA: 1.0000 "application " | TOPICAL_CREAM | Freq: Two times a day (BID) | RECTAL | 2 refills | Status: DC

## 2017-04-20 NOTE — Progress Notes (Signed)
Cardiology Office Note  Date:  04/22/2017   ID:  Martin, Lopez 09/22/1945, MRN [id number]  PCP:  [name], MD   Chief Complaint  Patient presents with  . Other    12 month follow up. Patient c/o swelling in ankles. Patient denies chest pain and SOB. Meds reviewed verbally with patient.     HPI:  Mr. Martin Lopez is a very pleasant 72 year-old gentleman with a history of hypertension,  hyperlipidemia,  strong family history of coronary artery disease  Prior cardiac calcium score in 2009 of  Zero  s/p hemorrhoidectomy who presents  for routine followup of his hypertension and hyperlipidemia.   Stress, sick mom, age 66 yo Otherwise feels well, Still exercising,  BP running low at home, 110 to 120s Would like to come down on some of the doses of his medications  Having right knee problem, suspect arthritis  Reports he has walked for 3012 days straight  Denies any chest pain or shortness of breath concerning for angina  Total cholesterol 160s  EKG personally reviewed by myself on todays  visit Shows normal sinus rhythm 76 bpm no significant ST or T wave changes  Previous CT scan of the chest showing no significant coronary calcifications, no significant plaque in the aorta   PMH:   has a past medical history of Allergy, Bone spur, Bulging disc (2013), Cancer ([place]), GERD (gastroesophageal reflux disease), HLD (hyperlipidemia), and HTN (hypertension).  PSH:    Past Surgical History:  Procedure Laterality Date  . BASAL CELL CARCINOMA EXCISION  2013   right leg,FOREHEAD  . COLONOSCOPY  2016  . COLONOSCOPY    . GREEN LIGHT LASER TURP (TRANSURETHRAL RESECTION OF PROSTATE N/A 09/21/2014   Procedure: GREEN LIGHT LASER TURP (TRANSURETHRAL RESECTION OF PROSTATE;  Surgeon: [name], MD;  Location: [facility];  Service: Urology;  Laterality: N/A;  . HEMORRHOID SURGERY N/A 02/28/2016   Procedure: HEMORRHOIDECTOMY;  Surgeon: [name], MD;  Location: [facility];   Service: General;  Laterality: N/A;  . TONSILLECTOMY      Current Outpatient Medications  Medication Sig Dispense Refill  . amLODipine (NORVASC) 5 MG tablet Take 5 mg by mouth daily.    Marland Kitchen aspirin EC 81 MG tablet Take 81 mg by mouth daily.    . Emollient (GOLD BOND MEDICATED BODY) 5-0.15 % LOTN Apply 1 application topically 2 (two) times daily.    Marland Kitchen esomeprazole (NEXIUM) 40 MG capsule Take 40 mg by mouth daily before breakfast. Takes 0.5 tablet    . hydrocortisone (ANUSOL-HC) 2.5 % rectal cream Place 1 application rectally 2 (two) times daily. 30 g 2  . ibuprofen (ADVIL,MOTRIN) 200 MG tablet Take 400 mg by mouth every 6 (six) hours as needed for mild pain or moderate pain.    Marland Kitchen irbesartan (AVAPRO) 300 MG tablet TAKE 1 TABLET DAILY 90 tablet 2  . Multiple Vitamin (MULTIVITAMIN) capsule Take 1 capsule by mouth daily.    . rosuvastatin (CRESTOR) 10 MG tablet Take 10 mg by mouth every other day.     . terazosin (HYTRIN) 10 MG capsule Take 1 capsule (10 mg total) by mouth at bedtime.    Marland Kitchen zolpidem (AMBIEN) 5 MG tablet Take 2.5 mg by mouth at bedtime as needed for sleep. Pt takes 1/2 tablet each night     No current facility-administered medications for this visit.      Allergies:   Patient has no known allergies.   Social History:  The patient  reports that he has never smoked. He has never used smokeless tobacco. He reports that he drinks about 0.6 oz of alcohol per week. He reports that he does not use drugs.   Family History:   family history includes Cancer in his father; Coronary artery disease in his maternal uncle; Ovarian cancer in his mother.    Review of Systems: Review of Systems  Constitutional: Negative.   Respiratory: Negative.   Cardiovascular: Negative.   Gastrointestinal: Negative.   Musculoskeletal: Positive for joint pain.  Neurological: Negative.   Psychiatric/Behavioral: Negative.   All other systems reviewed and are negative.    PHYSICAL EXAM: VS:  BP 120/66  (BP Location: Left Arm, Patient Position: Sitting, Cuff Size: Normal)   Pulse 76   Ht 5\' 8"  (1.727 m)   Wt 173 lb (78.5 kg)   BMI 26.30 kg/m  , BMI Body mass index is 26.3 kg/m. Constitutional:  oriented to person, place, and time. No distress.  HENT:  Head: Normocephalic and atraumatic.  Eyes:  no discharge. No scleral icterus.  Neck: Normal range of motion. Neck supple. No JVD present.  Cardiovascular: Normal rate, regular rhythm, normal heart sounds and intact distal pulses. Exam reveals no gallop and no friction rub. No edema No murmur heard. Pulmonary/Chest: Effort normal and breath sounds normal. No stridor. No respiratory distress.  no wheezes.  no rales.  no tenderness.  Abdominal: Soft.  no distension.  no tenderness.  Musculoskeletal: Normal range of motion.  no  tenderness or deformity.  Neurological:  normal muscle tone. Coordination normal. No atrophy Skin: Skin is warm and dry. No rash noted. not diaphoretic.  Psychiatric:  normal mood and affect. behavior is normal. Thought content normal.    Recent Labs: No results found for requested labs within last 8760 hours.    Lipid Panel No results found for: CHOL, HDL, LDLCALC, TRIG    Wt Readings from Last 3 Encounters:  04/22/17 173 lb (78.5 kg)  12/18/16 172 lb (78 kg)  04/19/16 168 lb (76.2 kg)       ASSESSMENT AND PLAN:  HYPERTENSION, BENIGN - Plan: EKG 12-Lead We previously called in amlodipine 10 mg for blood pressure He reports he is cutting it in half taking 5 mg daily and blood pressure is well controlled He is interested in cutting the medications back further Suggested he could try to cut the amlodipine down to 2.5 or irbesartan down to 150  Mixed hyperlipidemia - Plan: EKG 12-Lead CT scan from 2012 reviewed showing minimal aortic plaque, minimal coronary calcification if any. Cholesterol well controlled  Shortness of breath - Plan: EKG 12-Lead Denies any symptoms, exercising on a regular  basis Stable, regular exercise on a daily basis   Total encounter time more than 25 minutes  Greater than 50% was spent in counseling and coordination of care with the patient   Disposition:   F/U  12 months   Orders Placed This Encounter  Procedures  . EKG 12-Lead     Signed, Esmond Plants, M.D., Ph.D. 04/22/2017  [place], [place]

## 2017-04-22 ENCOUNTER — Encounter: Payer: Self-pay | Admitting: Cardiovascular Disease

## 2017-04-22 ENCOUNTER — Ambulatory Visit (INDEPENDENT_AMBULATORY_CARE_PROVIDER_SITE_OTHER): Payer: Medicare Other | Admitting: Cardiovascular Disease

## 2017-04-22 VITALS — BP 120/66 | HR 76 | Ht 68.0 in | Wt 173.0 lb

## 2017-04-22 DIAGNOSIS — I10 Essential (primary) hypertension: Secondary | ICD-10-CM

## 2017-04-22 DIAGNOSIS — E782 Mixed hyperlipidemia: Secondary | ICD-10-CM | POA: Diagnosis not present

## 2017-04-22 NOTE — Patient Instructions (Signed)
Medication Instructions:   No medication changes made  Play with the meds, ok to wean down on amlodipine or irbesartan  Labwork:  No new labs needed  Testing/Procedures:  No further testing at this time   Follow-Up: It was a pleasure seeing you in the office today. Please call us if you have new issues that need to be addressed before your next appt.  [phone]  Your physician wants you to follow-up in: 12 months.  You will receive a reminder letter in the mail two months in advance. If you don't receive a letter, please call our office to schedule the follow-up appointment.  If you need a refill on your cardiac medications before your next appointment, please call your pharmacy.  For educational health videos Log in to : www.myemmi.com Or : [email or web address], password : [email or web address]

## 2017-06-26 DIAGNOSIS — M7710 Lateral epicondylitis, unspecified elbow: Secondary | ICD-10-CM | POA: Insufficient documentation

## 2017-06-26 DIAGNOSIS — M179 Osteoarthritis of knee, unspecified: Secondary | ICD-10-CM | POA: Insufficient documentation

## 2017-06-26 DIAGNOSIS — M1990 Unspecified osteoarthritis, unspecified site: Secondary | ICD-10-CM | POA: Insufficient documentation

## 2017-11-04 ENCOUNTER — Other Ambulatory Visit: Payer: Self-pay | Admitting: General Surgery

## 2018-02-06 ENCOUNTER — Other Ambulatory Visit: Payer: Self-pay | Admitting: Cardiovascular Disease

## 2018-02-06 MED ORDER — AMLODIPINE BESYLATE 5 MG PO TABS: 5.0000 mg | ORAL_TABLET | Freq: Every day | ORAL | 1 refills | Status: DC

## 2018-02-06 NOTE — Telephone Encounter (Signed)
Pt is cutting the Amlodipine 10 mg tablets  In half.  He would like to get the Amlodipine in the 5mg  strength so he does not have to cut them in half.  Please send this to mail order for a 90 day supply if approved.  Thank you.

## 2018-02-06 NOTE — Telephone Encounter (Signed)
Done

## 2018-04-16 ENCOUNTER — Telehealth: Payer: Self-pay

## 2018-04-16 NOTE — Telephone Encounter (Signed)
Called patient.  Patient is interested in the Telephone visit but I caught him at a bad time to read the consent.  Patient stated he would call back for the consent to be read to him.  Told him he could ask for me when he returned the call.

## 2018-04-24 NOTE — Telephone Encounter (Signed)
LMOV for patient to call back.  Need to change his appointment to telephone or video

## 2018-04-28 ENCOUNTER — Ambulatory Visit: Payer: Medicare Other | Admitting: Cardiovascular Disease

## 2018-05-13 ENCOUNTER — Other Ambulatory Visit: Payer: Self-pay | Admitting: General Surgery

## 2018-05-13 ENCOUNTER — Telehealth: Payer: Self-pay | Admitting: *Deleted

## 2018-05-13 NOTE — Telephone Encounter (Signed)
Aware of refill an appointment was made for June 2. He appreciates call.

## 2018-05-13 NOTE — Telephone Encounter (Signed)
-----   Message from Robert Bellow, MD sent at 05/13/2018 11:58 AM EDT ----- Notify patient refill sent in for hydrocortisone cream. He will need an exam before any more refills. Thanks.

## 2018-05-29 ENCOUNTER — Other Ambulatory Visit: Payer: Self-pay | Admitting: Cardiovascular Disease

## 2018-06-24 ENCOUNTER — Other Ambulatory Visit: Payer: Self-pay

## 2018-06-24 ENCOUNTER — Telehealth: Payer: Self-pay

## 2018-06-24 ENCOUNTER — Encounter: Payer: Self-pay | Admitting: General Surgery

## 2018-06-24 ENCOUNTER — Ambulatory Visit (INDEPENDENT_AMBULATORY_CARE_PROVIDER_SITE_OTHER): Payer: Medicare Other | Admitting: General Surgery

## 2018-06-24 VITALS — BP 126/79 | HR 85 | Temp 97.9°F | Resp 12 | Ht 68.0 in | Wt 168.0 lb

## 2018-06-24 DIAGNOSIS — K625 Hemorrhage of anus and rectum: Secondary | ICD-10-CM | POA: Diagnosis not present

## 2018-06-24 MED ORDER — HYDROCORTISONE (PERIANAL) 2.5 % EX CREA: 1.0000 "application " | TOPICAL_CREAM | Freq: Two times a day (BID) | CUTANEOUS | 3 refills | Status: AC

## 2018-06-24 NOTE — Progress Notes (Signed)
Patient ID: Martin Lopez, male   DOB: 09-Feb-1945, 73 y.o.   MRN: [id number]  Chief Complaint  Patient presents with  . Follow-up    HPI Martin Lopez is a 73 y.o. male.  Here for follow up external hemorrhoids former patient of Dr Jamal Collin. He just doesn't feel "things are right". He states his bowels move daily. He states about 3 times a week he notices a small spot of rectal bleeding.  Bright red blood.  No pain.  No blood in the bowel movement itself.  Symptoms date back to 2016 colonoscopy completed by Erskine Emery, MD for GI bleeding and rectal bleeding.  Normal exam with the exception of some internal hemorrhoids.  10-year follow-up recommended.   HPI  Past Medical History:  Diagnosis Date  . Allergy   . Bone spur    kneck  . Bulging disc 2013  . Cancer (HCC)    basal cell ca - bil, bil legs, scalp, face  . GERD (gastroesophageal reflux disease)   . HLD (hyperlipidemia)   . HTN (hypertension)     Past Surgical History:  Procedure Laterality Date  . BASAL CELL CARCINOMA EXCISION  2013   right leg,FOREHEAD  . COLONOSCOPY  2016  . COLONOSCOPY    . GREEN LIGHT LASER TURP (TRANSURETHRAL RESECTION OF PROSTATE N/A 09/21/2014   Procedure: GREEN LIGHT LASER TURP (TRANSURETHRAL RESECTION OF PROSTATE;  Surgeon: [name], MD;  Location: [facility];  Service: Urology;  Laterality: N/A;  . HEMORRHOID SURGERY N/A 02/28/2016   Procedure: HEMORRHOIDECTOMY;  Surgeon: [name], MD;  Location: [facility];  Service: General;  Laterality: N/A;  . TONSILLECTOMY      Family History  Problem Relation Age of Onset  . Ovarian cancer Mother   . Cancer Father        Martin Lopez  . Coronary artery disease Maternal Uncle        x2  . Colon cancer Neg Hx   . Esophageal cancer Neg Hx   . Gallbladder disease Neg Hx   . Rectal cancer Neg Hx   . Stomach cancer Neg Hx     Social History Social History   Tobacco Use  . Smoking status: Never Smoker  . Smokeless tobacco:  Never Used  . Tobacco comment: tobacco use- no   Substance Use Topics  . Alcohol use: Yes    Alcohol/week: 1.0 standard drinks    Types: 1 Standard drinks or equivalent per week    Comment: 2 mixed drink a day  . Drug use: No    No Known Allergies  Current Outpatient Medications  Medication Sig Dispense Refill  . amLODipine (NORVASC) 5 MG tablet TAKE 1 TABLET DAILY 90 tablet 0  . aspirin EC 81 MG tablet Take 81 mg by mouth daily.    . Emollient (GOLD BOND MEDICATED BODY) 5-0.15 % LOTN Apply 1 application topically 2 (two) times daily.    Marland Kitchen esomeprazole (NEXIUM) 40 MG capsule Take 40 mg by mouth daily before breakfast. Takes 0.5 tablet    . hydrocortisone 2.5 % cream APPLY RECTALLY TWICE DAILY 30 g 0  . ibuprofen (ADVIL,MOTRIN) 200 MG tablet Take 400 mg by mouth every 6 (six) hours as needed for mild pain or moderate pain.    Marland Kitchen irbesartan (AVAPRO) 300 MG tablet TAKE 1 TABLET DAILY 90 tablet 2  . Multiple Vitamin (MULTIVITAMIN) capsule Take 1 capsule by mouth daily.    . rosuvastatin (CRESTOR) 10 MG tablet Take 10 mg  by mouth every other day.     . terazosin (HYTRIN) 10 MG capsule Take 1 capsule (10 mg total) by mouth at bedtime.    Marland Kitchen zolpidem (AMBIEN) 5 MG tablet Take 2.5 mg by mouth at bedtime as needed for sleep. Pt takes 1/2 tablet each night    . hydrocortisone (ANUSOL-HC) 2.5 % rectal cream Place 1 application rectally 2 (two) times daily. As needed. 30 g 3   No current facility-administered medications for this visit.     Review of Systems Review of Systems  Constitutional: Negative.   Respiratory: Negative.   Cardiovascular: Negative.   Gastrointestinal: Positive for anal bleeding. Negative for constipation and diarrhea.    Blood pressure 126/79, pulse 85, temperature 97.9 F (36.6 C), temperature source Temporal, resp. rate 12, height 5\' 8"  (1.727 m), weight 168 lb (76.2 kg), SpO2 96 %.  Physical Exam Physical Exam Constitutional:      Appearance: Normal appearance.   Neck:     Musculoskeletal: Normal range of motion and neck supple.  Cardiovascular:     Rate and Rhythm: Regular rhythm.     Pulses: Normal pulses.     Heart sounds: Normal heart sounds.  Pulmonary:     Effort: Pulmonary effort is normal.     Breath sounds: Normal breath sounds.  Abdominal:     General: Abdomen is flat. There is no distension.     Palpations: Abdomen is soft. There is no mass.  Genitourinary:    Scrotum/Testes: Normal.     Rectum: Guaiac result negative.    Neurological:     Mental Status: He is alert.     Data Reviewed 2016 colonoscopy.  Anoscopy: No lesions of the lower rectum.  Small focal area of irritation, 2-3 mm at the anorectal junction posteriorly.  No active bleeding.  Assessment Intermittent rectal bleeding without evidence of higher source.  Plan Symptomatic management with application of Anusol HC cream to the posterior anus as needed.   HPI, assessment, plan and physical exam has been scribed under the direction and in the presence of Martin Bellow, MD. Martin Fetch, RN  I have completed the exam and reviewed the above documentation for accuracy and completeness.  I agree with the above.  Martin Lopez has been used and any errors in dictation or transcription are unintentional.  Martin Lopez, M.D., F.A.C.S.  Martin Lopez Martin Lopez 06/25/2018, 9:08 AM

## 2018-06-24 NOTE — Patient Instructions (Signed)
Apply Anusol cream to back of anal area if needed for bleeding.

## 2018-06-24 NOTE — Telephone Encounter (Signed)
Called patient.  Made him aware that Dr. [name] is still limiting patients coming into the office.  He stated that he had an appointment yesterday and another appointment today that are both in office and does not understand who Dr. [name] is not seeing patients in office.   Made him aware that Dr. [name] just doesn't want to expose anyone to anything that may be here in the hospital. They have limited patients to coming in and out one door to the hospital.   He stated that he would like to wait for an in office appointment.

## 2018-06-30 ENCOUNTER — Ambulatory Visit: Payer: Medicare Other | Admitting: Cardiovascular Disease

## 2018-07-08 ENCOUNTER — Telehealth: Payer: Self-pay

## 2018-07-08 NOTE — Telephone Encounter (Signed)
Left voicemail message requesting for patient to call back to schedule his F/U appointment with Dr. [name].

## 2018-07-31 ENCOUNTER — Other Ambulatory Visit: Payer: Self-pay

## 2018-07-31 MED ORDER — AMLODIPINE BESYLATE 5 MG PO TABS: 5.0000 mg | ORAL_TABLET | Freq: Every day | ORAL | 0 refills | Status: DC

## 2018-07-31 NOTE — Telephone Encounter (Signed)
Requested Prescriptions   Signed Prescriptions Disp Refills  . amLODipine (NORVASC) 5 MG tablet 90 tablet 0    Sig: Take 1 tablet (5 mg total) by mouth daily.    Authorizing Provider: Minna Merritts    Ordering User: Janan Ridge

## 2018-08-25 ENCOUNTER — Encounter: Payer: Self-pay | Admitting: Cardiovascular Disease

## 2018-08-28 ENCOUNTER — Other Ambulatory Visit: Payer: Self-pay | Admitting: Cardiovascular Disease

## 2018-09-01 ENCOUNTER — Ambulatory Visit: Payer: Medicare Other | Admitting: Cardiovascular Disease

## 2018-10-05 NOTE — Progress Notes (Signed)
Cardiology Office Note  Date:  10/07/2018   ID:  Martin Lopez, Martin Lopez [birth year], MRN [id number]  PCP:  [name], MD   Chief Complaint  Patient presents with  . other    OD 12 month f/u. Meds reviewed verbally with pt.    HPI:  Martin Lopez is a very pleasant 73 year-old gentleman with a history of hypertension,  hyperlipidemia,  strong family history of coronary artery disease  Prior cardiac calcium score in 2009 of  Zero  s/p hemorrhoidectomy who presents  for routine followup of his hypertension and hyperlipidemia.   Overall reports he is doing well BP 110/65 to 140/80 at home  On norvasc 5, irbesartan 150 daily crestor 10 qod  Sluggish in AM, continues to exercise at least 30 min every morning  Lost mom, age 63 yo Stressful  Started to have some diarrhea daily sometimes twice a day Has not seen GI, needs to have colonoscopy  Denies chest pain or shortness of breath on exertion  Total cholesterol 158, LDL 80  EKG personally reviewed by myself on todays  visit Shows normal sinus rhythm 78 bpm no significant ST or T wave changes rare PVCs  Previous CT scan of the chest showing no significant coronary calcifications, no significant plaque in the aorta   PMH:   has a past medical history of Allergy, Bone spur, Bulging disc (2013), Cancer ([place]), GERD (gastroesophageal reflux disease), HLD (hyperlipidemia), and HTN (hypertension).  PSH:    Past Surgical History:  Procedure Laterality Date  . BASAL CELL CARCINOMA EXCISION  2013   right leg,FOREHEAD  . COLONOSCOPY  2016  . COLONOSCOPY    . GREEN LIGHT LASER TURP (TRANSURETHRAL RESECTION OF PROSTATE N/A 09/21/2014   Procedure: GREEN LIGHT LASER TURP (TRANSURETHRAL RESECTION OF PROSTATE;  Surgeon: [name], MD;  Location: [facility];  Service: Urology;  Laterality: N/A;  . HEMORRHOID SURGERY N/A 02/28/2016   Procedure: HEMORRHOIDECTOMY;  Surgeon: [name], MD;  Location: [facility];  Service:  General;  Laterality: N/A;  . TONSILLECTOMY      Current Outpatient Medications  Medication Sig Dispense Refill  . amLODipine (NORVASC) 5 MG tablet TAKE 1 TABLET DAILY, MUST  KEEP UPCOMING APPOINTMENT  FOR REFILLS 90 tablet 0  . aspirin EC 81 MG tablet Take 81 mg by mouth daily.    Marland Kitchen esomeprazole (NEXIUM) 40 MG capsule Take 40 mg by mouth daily before breakfast. Takes 0.5 tablet    . hydrocortisone (ANUSOL-HC) 2.5 % rectal cream Place 1 application rectally 2 (two) times daily. As needed. 30 g 3  . ibuprofen (ADVIL,MOTRIN) 200 MG tablet Take 400 mg by mouth every 6 (six) hours as needed for mild pain or moderate pain.    Marland Kitchen irbesartan (AVAPRO) 300 MG tablet TAKE 1 TABLET DAILY 90 tablet 2  . Multiple Vitamin (MULTIVITAMIN) capsule Take 1 capsule by mouth daily.    . rosuvastatin (CRESTOR) 10 MG tablet Take 10 mg by mouth every other day.     . terazosin (HYTRIN) 10 MG capsule Take 1 capsule (10 mg total) by mouth at bedtime.    Marland Kitchen zolpidem (AMBIEN) 5 MG tablet Take 5 mg by mouth at bedtime as needed for sleep. Pt takes 1/2 tablet each night     No current facility-administered medications for this visit.      Allergies:   Patient has no known allergies.   Social History:  The patient  reports that he has never smoked. He has  never used smokeless tobacco. He reports current alcohol use of about 1.0 standard drinks of alcohol per week. He reports that he does not use drugs.   Family History:   family history includes Cancer in his father; Coronary artery disease in his maternal uncle; Ovarian cancer in his mother.    Review of Systems: Review of Systems  Constitutional: Negative.   HENT: Negative.   Respiratory: Negative.   Cardiovascular: Negative.   Gastrointestinal: Positive for diarrhea.  Musculoskeletal: Positive for joint pain.  Neurological: Negative.   Psychiatric/Behavioral: Negative.   All other systems reviewed and are negative.   PHYSICAL EXAM: VS:  BP 120/80 (BP  Location: Left Arm, Patient Position: Sitting, Cuff Size: Normal)   Pulse 72   Ht 5\' 8"  (1.727 m)   Wt 171 lb (77.6 kg)   SpO2 98%   BMI 26.00 kg/m  , BMI Body mass index is 26 kg/m. Constitutional:  oriented to person, place, and time. No distress.  HENT:  Head: Normocephalic and atraumatic.  Eyes:  no discharge. No scleral icterus.  Neck: Normal range of motion. Neck supple. No JVD present.  Cardiovascular: Normal rate, regular rhythm, normal heart sounds and intact distal pulses. Exam reveals no gallop and no friction rub. No edema No murmur heard. Pulmonary/Chest: Effort normal and breath sounds normal. No stridor. No respiratory distress.  no wheezes.  no rales.  no tenderness.  Abdominal: Soft.  no distension.  no tenderness.  Musculoskeletal: Normal range of motion.  no  tenderness or deformity.  Neurological:  normal muscle tone. Coordination normal. No atrophy Skin: Skin is warm and dry. No rash noted. not diaphoretic.  Psychiatric:  normal mood and affect. behavior is normal. Thought content normal.    Recent Labs: No results found for requested labs within last 8760 hours.    Lipid Panel No results found for: CHOL, HDL, LDLCALC, TRIG    Wt Readings from Last 3 Encounters:  10/07/18 171 lb (77.6 kg)  06/24/18 168 lb (76.2 kg)  04/22/17 173 lb (78.5 kg)       ASSESSMENT AND PLAN:  HYPERTENSION, BENIGN - Blood pressure is well controlled on today's visit. No changes made to the medications.  Mixed hyperlipidemia  CT scan from 2012 showing minimal aortic plaque, minimal coronary calcification if any. No anginal symptoms, cholesterol at goal  Diarrhea Long discussion, etiology unclear Appears to be chronic issue, recommended he discuss with GI Referral name provided   Total encounter time more than 25 minutes  Greater than 50% was spent in counseling and coordination of care with the patient   Disposition:   F/U  12 months   Orders Placed This  Encounter  Procedures  . EKG 12-Lead     Signed, Esmond Plants, M.D., Ph.D. 10/07/2018  [place], [place]

## 2018-10-07 ENCOUNTER — Ambulatory Visit (INDEPENDENT_AMBULATORY_CARE_PROVIDER_SITE_OTHER): Payer: Medicare Other | Admitting: Cardiovascular Disease

## 2018-10-07 ENCOUNTER — Other Ambulatory Visit: Payer: Self-pay

## 2018-10-07 ENCOUNTER — Encounter: Payer: Self-pay | Admitting: Cardiovascular Disease

## 2018-10-07 VITALS — BP 120/80 | HR 72 | Ht 68.0 in | Wt 171.0 lb

## 2018-10-07 DIAGNOSIS — E782 Mixed hyperlipidemia: Secondary | ICD-10-CM

## 2018-10-07 DIAGNOSIS — I10 Essential (primary) hypertension: Secondary | ICD-10-CM

## 2018-10-07 NOTE — Patient Instructions (Signed)
GI: Dr. Allen Norris   Medication Instructions:  No changes  If you need a refill on your cardiac medications before your next appointment, please call your pharmacy.    Lab work: No new labs needed   If you have labs (blood work) drawn today and your tests are completely normal, you will receive your results only by: Marland Kitchen MyChart Message (if you have MyChart) OR . A paper copy in the mail If you have any lab test that is abnormal or we need to change your treatment, we will call you to review the results.   Testing/Procedures: No new testing needed   Follow-Up: At [facility], you and your health needs are our priority.  As part of our continuing mission to provide you with exceptional heart care, we have created designated Provider Care Teams.  These Care Teams include your primary Cardiologist (physician) and Advanced Practice Providers (APPs -  Physician Assistants and Nurse Practitioners) who all work together to provide you with the care you need, when you need it.  . You will need a follow up appointment in 12 months .   Please call our office 2 months in advance to schedule this appointment.    . Providers on your designated Care Team:   . Murray Hodgkins, NP . Christell Faith, PA-C . Marrianne Mood, PA-C  Any Other Special Instructions Will Be Listed Below (If Applicable).  For educational health videos Log in to : [email or web address] Or : [email or web address], password : [email or web address]

## 2018-11-03 ENCOUNTER — Telehealth: Payer: Self-pay | Admitting: Cardiovascular Disease

## 2018-11-03 DIAGNOSIS — K529 Noninfective gastroenteritis and colitis, unspecified: Secondary | ICD-10-CM

## 2018-11-03 NOTE — Telephone Encounter (Signed)
I spoke with the patient to inquire if he had tried to call Dr. Dorothey Baseman office and they were asking for a referral. Per the patient, he did call Dr. Dorothey Baseman office this morning and they stated he needed a referral from Dr. [name]. I advised the patient I would place the referral and touch base with Dr. Dorothey Baseman office to see if they could see this in Epic.  The patient voices understanding and is agreeable.  I attempted to call Dr. Dorothey Baseman office at [phone]. No answer- I left a message to please call back to confirm if they could see this referral.

## 2018-11-03 NOTE — Telephone Encounter (Signed)
Patient spoke with Ollen Bowl Referral   Not in epic please order.

## 2018-11-04 NOTE — Telephone Encounter (Signed)
The patient has an appointment scheduled 12/01/18 with Dr. Allen Norris.

## 2018-12-01 ENCOUNTER — Ambulatory Visit (INDEPENDENT_AMBULATORY_CARE_PROVIDER_SITE_OTHER): Payer: Medicare Other | Admitting: Gastroenterology

## 2018-12-01 ENCOUNTER — Encounter: Payer: Self-pay | Admitting: Gastroenterology

## 2018-12-01 ENCOUNTER — Encounter (INDEPENDENT_AMBULATORY_CARE_PROVIDER_SITE_OTHER): Payer: Self-pay

## 2018-12-01 ENCOUNTER — Other Ambulatory Visit: Payer: Self-pay

## 2018-12-01 VITALS — BP 136/76 | HR 80 | Temp 98.3°F | Ht 68.0 in | Wt 170.8 lb

## 2018-12-01 DIAGNOSIS — R197 Diarrhea, unspecified: Secondary | ICD-10-CM

## 2018-12-01 NOTE — Progress Notes (Signed)
Gastroenterology Consultation  Referring Provider:     Minna Merritts, MD Primary Care Physician:  [name], MD Primary Gastroenterologist:  Dr. Allen Norris     Reason for Consultation:     Diarrhea        HPI:   Martin Lopez is a 73 y.o. y/o male referred for consultation & management of diarrhea by Dr. Hall Busing, Leona Carry, MD.  This patient comes in today with a report of having more frequent and more loose stools that he had in the past.  The patient is also reporting that he had a colonoscopy after having hemorrhoidectomy and now he feels something out of his rectum that he thinks may be an external hemorrhoid.  The patient denies any unexplained weight loss fevers chills nausea or vomiting.  At the time of the patient's last colonoscopy he was recommended to have a repeat colonoscopy in 10 years.  That was in 2016.  He denies the diarrhea waking him up from a sound sleep.  He also reports that he does have a lot of gas and will often have explosive diarrhea.  Past Medical History:  Diagnosis Date  . Allergy   . Bone spur    kneck  . Bulging disc 2013  . Cancer (HCC)    basal cell ca - bil, bil legs, scalp, face  . GERD (gastroesophageal reflux disease)   . HLD (hyperlipidemia)   . HTN (hypertension)     Past Surgical History:  Procedure Laterality Date  . BASAL CELL CARCINOMA EXCISION  2013   right leg,FOREHEAD  . COLONOSCOPY  2016  . COLONOSCOPY    . GREEN LIGHT LASER TURP (TRANSURETHRAL RESECTION OF PROSTATE N/A 09/21/2014   Procedure: GREEN LIGHT LASER TURP (TRANSURETHRAL RESECTION OF PROSTATE;  Surgeon: [name], MD;  Location: [facility];  Service: Urology;  Laterality: N/A;  . HEMORRHOID SURGERY N/A 02/28/2016   Procedure: HEMORRHOIDECTOMY;  Surgeon: [name], MD;  Location: [facility];  Service: General;  Laterality: N/A;  . TONSILLECTOMY      Prior to Admission medications   Medication Sig Start Date End Date Taking? Authorizing Provider   amLODipine (NORVASC) 5 MG tablet TAKE 1 TABLET DAILY, MUST  KEEP UPCOMING APPOINTMENT  FOR REFILLS 08/28/18  Yes Gollan, Kathlene November, MD  aspirin EC 81 MG tablet Take 81 mg by mouth daily.   Yes [provider]  esomeprazole (NEXIUM) 40 MG capsule Take 40 mg by mouth daily before breakfast. Takes 0.5 tablet   Yes [provider]  hydrocortisone (ANUSOL-HC) 2.5 % rectal cream Place 1 application rectally 2 (two) times daily. As needed. 06/24/18  Yes Byrnett, Forest Gleason, MD  ibuprofen (ADVIL,MOTRIN) 200 MG tablet Take 400 mg by mouth every 6 (six) hours as needed for mild pain or moderate pain.   Yes [provider]  irbesartan (AVAPRO) 300 MG tablet TAKE 1 TABLET DAILY 06/28/16  Yes Gollan, Kathlene November, MD  Multiple Vitamin (MULTIVITAMIN) capsule Take 1 capsule by mouth daily.   Yes [provider]  rosuvastatin (CRESTOR) 10 MG tablet Take 10 mg by mouth every other day.    Yes [provider]  terazosin (HYTRIN) 10 MG capsule Take 1 capsule (10 mg total) by mouth at bedtime. 04/20/15  Yes Gollan, Kathlene November, MD  zolpidem (AMBIEN) 5 MG tablet Take 5 mg by mouth at bedtime as needed for sleep. Pt takes 1/2 tablet each night   Yes [provider]  Family History  Problem Relation Age of Onset  . Ovarian cancer Mother   . Cancer Father        [name]  . Coronary artery disease Maternal Uncle        x2  . Colon cancer Neg Hx   . Esophageal cancer Neg Hx   . Gallbladder disease Neg Hx   . Rectal cancer Neg Hx   . Stomach cancer Neg Hx      Social History   Tobacco Use  . Smoking status: Never Smoker  . Smokeless tobacco: Never Used  . Tobacco comment: tobacco use- no   Substance Use Topics  . Alcohol use: Yes    Alcohol/week: 1.0 standard drinks    Types: 1 Standard drinks or equivalent per week    Comment: 2 mixed drink a day  . Drug use: No    Allergies as of 12/01/2018  . (No Known Allergies)    Review of Systems:    All  systems reviewed and negative except where noted in HPI.   Physical Exam:  BP 136/76   Pulse 80   Temp 98.3 F (36.8 C) (Temporal)   Ht 5\' 8"  (1.727 m)   Wt 170 lb 12.8 oz (77.5 kg)   BMI 25.97 kg/m  No LMP for male patient. General:   Alert,  Well-developed, well-nourished, pleasant and cooperative in NAD Head:  Normocephalic and atraumatic. Eyes:  Sclera clear, no icterus.   Conjunctiva pink. Ears:  Normal auditory acuity. Neck:  Supple; no masses or thyromegaly. Lungs:  Respirations even and unlabored.  Clear throughout to auscultation.   No wheezes, crackles, or rhonchi. No acute distress. Heart:  Regular rate and rhythm; no murmurs, clicks, rubs, or gallops. Abdomen:  Normal bowel sounds.  No bruits.  Soft, non-tender and non-distended without masses, hepatosplenomegaly or hernias noted.  No guarding or rebound tenderness.  Negative Carnett sign.   Rectal: External exam showed a skin tag without any external hemorrhoids noted Msk:  Symmetrical without gross deformities.  Good, equal movement & strength bilaterally. Pulses:  Normal pulses noted. Extremities:  No clubbing or edema.  No cyanosis. Neurologic:  Alert and oriented x3;  grossly normal neurologically. Skin:  Intact without significant lesions or rashes.  No jaundice. Lymph Nodes:  No significant cervical adenopathy. Psych:  Alert and cooperative. Normal mood and affect.  Imaging Studies: No results found.  Assessment and Plan:   Martin Lopez is a 73 y.o. y/o male who comes in today with a report of diarrhea.  The patient has been told to avoid milk products for 1 week and see if that helps his symptoms.  The patient symptoms are consistent with a large bowel issue since he is not having any weight loss to indicate a small bowel malabsorptive issue.  The patient has been told that since he has gas and bloating that he likely is eating something that is fermenting and causing the gas in his diarrhea.  If stopping the  milk products do not solve his issue he has been told to keep a food log.  The patient also has been told that since his skin tag is not bothering him he should not have anything done to it unless the lesion starts bothering him.  The patient has been explained the plan and agrees with it.    Lucilla Lame, MD. Marval Regal    Note: This dictation was prepared with Dragon dictation along with smaller phrase technology. Any transcriptional errors that result  from this process are unintentional.

## 2018-12-10 DIAGNOSIS — C439 Malignant melanoma of skin, unspecified: Secondary | ICD-10-CM

## 2018-12-10 HISTORY — DX: Malignant melanoma of skin, unspecified: C43.9

## 2019-01-29 ENCOUNTER — Other Ambulatory Visit: Payer: Self-pay | Admitting: Cardiovascular Disease

## 2019-02-11 ENCOUNTER — Other Ambulatory Visit: Payer: Self-pay | Admitting: Cardiovascular Disease

## 2019-02-11 MED ORDER — AMLODIPINE BESYLATE 5 MG PO TABS: 5.0000 mg | ORAL_TABLET | Freq: Every day | ORAL | 2 refills | Status: DC

## 2019-02-11 NOTE — Telephone Encounter (Signed)
*  STAT* If patient is at the pharmacy, call can be transferred to refill team.   1. Which medications need to be refilled? (please list name of each medication and dose if known)    Amlodipine 5 mg po q d   2. Which pharmacy/location (including street and city if local pharmacy) is medication to be sent to? [facility]   3. Do they need a 30 day or 90 day supply? 90   Patient not due for fu until september

## 2019-02-11 NOTE — Telephone Encounter (Signed)
Requested Prescriptions   Signed Prescriptions Disp Refills   amLODipine (NORVASC) 5 MG tablet 90 tablet 2    Sig: Take 1 tablet (5 mg total) by mouth daily.    Authorizing Provider: Minna Merritts    Ordering User: Raelene Bott, Havanah Nelms L

## 2019-02-13 ENCOUNTER — Other Ambulatory Visit: Payer: Self-pay

## 2019-02-13 MED ORDER — AMLODIPINE BESYLATE 5 MG PO TABS: 5.0000 mg | ORAL_TABLET | Freq: Every day | ORAL | 2 refills | Status: DC

## 2019-03-03 ENCOUNTER — Other Ambulatory Visit: Payer: Self-pay | Admitting: Cardiovascular Disease

## 2019-03-10 DIAGNOSIS — C439 Malignant melanoma of skin, unspecified: Secondary | ICD-10-CM | POA: Insufficient documentation

## 2019-03-10 DIAGNOSIS — Z79899 Other long term (current) drug therapy: Secondary | ICD-10-CM | POA: Insufficient documentation

## 2019-03-21 ENCOUNTER — Other Ambulatory Visit: Payer: Self-pay | Admitting: Cardiovascular Disease

## 2019-04-08 ENCOUNTER — Other Ambulatory Visit: Payer: Self-pay

## 2019-04-08 ENCOUNTER — Ambulatory Visit (INDEPENDENT_AMBULATORY_CARE_PROVIDER_SITE_OTHER): Payer: Medicare Other | Admitting: Dermatology

## 2019-04-08 DIAGNOSIS — Z85820 Personal history of malignant melanoma of skin: Secondary | ICD-10-CM

## 2019-04-08 DIAGNOSIS — L820 Inflamed seborrheic keratosis: Secondary | ICD-10-CM

## 2019-04-08 DIAGNOSIS — L111 Transient acantholytic dermatosis [Grover]: Secondary | ICD-10-CM

## 2019-04-08 NOTE — Progress Notes (Signed)
   Follow-Up Visit   Subjective  Martin Lopez is a 74 y.o. 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  The following portions of the chart were reviewed this encounter and updated as appropriate:     Review of Systems: No other skin or systemic complaints.  Objective  Well appearing patient in no apparent distress; mood and affect are within normal limits.  A focused examination was performed including abdomen. Relevant physical exam findings are noted in the Assessment and Plan.  Objective  infra pectoral, epigastric: Red papules and papulovesicles.   Objective  R distal deltoid: Well healed scar with no evidence of recurrence, no lymphadenopathy.   Objective  Left anterior waistline x 6 (6): Erythematous keratotic or waxy stuck-on papule or plaque.   Assessment & Plan  Grover's disease infra pectoral, epigastric  Cont Triamcinolone cream prn avoid f/g/a   Information on Grover's disease given to patient   History of melanoma R distal deltoid  Discussed with patient ISK's at left waistline are not related to immunotherapy infusion patient has been having for hx of Melanoma  Inflamed seborrheic keratosis (6) Left anterior waistline x 6  Destruction of lesion - Left anterior waistline x 6 Complexity: simple   Destruction method: cryotherapy   Informed consent: discussed and consent obtained   Timeout:  patient name, date of birth, surgical site, and procedure verified Lesion destroyed using liquid nitrogen: Yes   Region frozen until ice ball extended beyond lesion: Yes   Outcome: patient tolerated procedure well with no complications   Post-procedure details: wound care instructions given    Return as  scheduled for TBSE.   Documentation: I agree with the above documentation.  [name], MD

## 2019-04-20 ENCOUNTER — Telehealth: Payer: Self-pay

## 2019-04-20 MED ORDER — TRIAMCINOLONE ACETONIDE 0.1 % EX CREA: 1.0000 "application " | TOPICAL_CREAM | Freq: Two times a day (BID) | CUTANEOUS | 2 refills | Status: DC | PRN

## 2019-04-20 NOTE — Telephone Encounter (Signed)
Pt called for refill of Triamcinolone crea, okay ERX'd TMC 0.1% cream 454gm 2 RF to [facility]

## 2019-04-30 ENCOUNTER — Other Ambulatory Visit: Payer: Self-pay

## 2019-04-30 MED ORDER — VALACYCLOVIR HCL 1 G PO TABS: 1000.0000 mg | ORAL_TABLET | ORAL | 6 refills | Status: AC

## 2019-06-10 ENCOUNTER — Ambulatory Visit (INDEPENDENT_AMBULATORY_CARE_PROVIDER_SITE_OTHER): Payer: Medicare Other | Admitting: Dermatology

## 2019-06-10 ENCOUNTER — Other Ambulatory Visit: Payer: Self-pay

## 2019-06-10 DIAGNOSIS — D229 Melanocytic nevi, unspecified: Secondary | ICD-10-CM

## 2019-06-10 DIAGNOSIS — L820 Inflamed seborrheic keratosis: Secondary | ICD-10-CM

## 2019-06-10 DIAGNOSIS — C4442 Squamous cell carcinoma of skin of scalp and neck: Secondary | ICD-10-CM | POA: Diagnosis not present

## 2019-06-10 DIAGNOSIS — L814 Other melanin hyperpigmentation: Secondary | ICD-10-CM

## 2019-06-10 DIAGNOSIS — Z1283 Encounter for screening for malignant neoplasm of skin: Secondary | ICD-10-CM

## 2019-06-10 DIAGNOSIS — I872 Venous insufficiency (chronic) (peripheral): Secondary | ICD-10-CM

## 2019-06-10 DIAGNOSIS — L821 Other seborrheic keratosis: Secondary | ICD-10-CM

## 2019-06-10 DIAGNOSIS — D1801 Hemangioma of skin and subcutaneous tissue: Secondary | ICD-10-CM | POA: Diagnosis not present

## 2019-06-10 DIAGNOSIS — Z85828 Personal history of other malignant neoplasm of skin: Secondary | ICD-10-CM

## 2019-06-10 DIAGNOSIS — D485 Neoplasm of uncertain behavior of skin: Secondary | ICD-10-CM

## 2019-06-10 DIAGNOSIS — L570 Actinic keratosis: Secondary | ICD-10-CM | POA: Diagnosis not present

## 2019-06-10 DIAGNOSIS — Z85820 Personal history of malignant melanoma of skin: Secondary | ICD-10-CM

## 2019-06-10 DIAGNOSIS — C4492 Squamous cell carcinoma of skin, unspecified: Secondary | ICD-10-CM

## 2019-06-10 DIAGNOSIS — L578 Other skin changes due to chronic exposure to nonionizing radiation: Secondary | ICD-10-CM

## 2019-06-10 HISTORY — DX: Squamous cell carcinoma of skin, unspecified: C44.92

## 2019-06-10 NOTE — Patient Instructions (Signed)
Shave Excision Benign Lesion Wound Care Instructions  . Leave the original bandage on for 24 hours if possible.  If the bandage becomes soaked or soiled before that time, it is OK to remove it and examine the wound.  A small amount of post-operative bleeding is normal.  If excessive bleeding occurs, remove the bandage, place gauze over the site and apply continuous pressure (no peeking) over the area for 20-30 minutes.  If this does not stop the bleeding, try again for 40 minutes.  If this does not work, please call our clinic as soon as possible (even if after-hours).    . Twice a day, cleanse the wound with soap and water.  If a thick crust develops you may use a Q-tip dipped into dilute hydrogen peroxide (mix 1:1 with water) to dissolve it.  Hydrogen peroxide can slow the healing process, so use it only as needed.  After washing, apply Vaseline jelly or Polysporin ointment.  For best healing, the wound should be covered with a layer of ointment at all times.  This may mean re-applying the ointment several times a day.  For open wounds, continue until it has healed.    . If you have any swelling, keep the area elevated.  . Some redness, tenderness and white or yellow material in the wound is normal healing.  If the area becomes very sore and red, or develops a thick yellow-green material (pus), it may be infected; please notify us.    . Wound healing continues for up to one year following surgery.  It is not unusual to experience pain in the scar from time to time during the interval.  If the pain becomes severe or the scar thickens, you should notify the office.  A slight amount of redness in a scar is expected for the first six months.  After six months, the redness subsides and the scar will soften and fade.  The color difference becomes less noticeable with time.  If there are any problems, return for a post-op surgery check at your earliest convenience.  . Please call our office for any questions  or concerns.   Electrodesiccation and Curettage ("Scrape and Burn") Wound Care Instructions  1. Leave the original bandage on for 24 hours if possible.  If the bandage becomes soaked or soiled before that time, it is OK to remove it and examine the wound.  A small amount of post-operative bleeding is normal.  If excessive bleeding occurs, remove the bandage, place gauze over the site and apply continuous pressure (no peeking) over the area for 30 minutes. If this does not work, please call our clinic as soon as possible or page your doctor if it is after hours.   2. Once a day, cleanse the wound with soap and water. It is fine to shower. If a thick crust develops you may use a Q-tip dipped into dilute hydrogen peroxide (mix 1:1 with water) to dissolve it.  Hydrogen peroxide can slow the healing process, so use it only as needed.    3. After washing, apply petroleum jelly (Vaseline) or an antibiotic ointment if your doctor prescribed one for you, followed by a bandage.    4. For best healing, the wound should be covered with a layer of ointment at all times. If you are not able to keep the area covered with a bandage to hold the ointment in place, this may mean re-applying the ointment several times a day.  Continue this wound care   until the wound has healed and is no longer open. It may take several weeks for the wound to heal and close.  Itching and mild discomfort is normal during the healing process.  If you have any discomfort, you can take Tylenol (acetaminophen) or ibuprofen as directed on the bottle. (Please do not take these if you have an allergy to them or cannot take them for another reason).  Some redness, tenderness and white or yellow material in the wound is normal healing.  If the area becomes very sore and red, or develops a thick yellow-green material (pus), it may be infected; please notify us.    Wound healing continues for up to one year following surgery. It is not unusual to  experience pain in the scar from time to time during the interval.  If the pain becomes severe or the scar thickens, you should notify the office.    A slight amount of redness in a scar is expected for the first six months.  After six months, the redness will fade and the scar will soften and fade.  The color difference becomes less noticeable with time.  If there are any problems, return for a post-op surgery check at your earliest convenience.  To improve the appearance of the scar, you can use silicone scar gel, cream, or sheets (such as Mederma or Serica) every night for up to one year. These are available over the counter (without a prescription).  Please call our office at [phone] for any questions or concerns.  

## 2019-06-10 NOTE — Progress Notes (Addendum)
Follow-Up Visit   Subjective  Martin Lopez is a 75 y.o. male who presents for the following: Actinic Keratosis (recheck previously treated areas on the face and scalp) and Annual Exam (history of MM, BCC's, AK's, ISK's). Patient presents for total body skin exam for skin cancer screening and mole check.  The following portions of the chart were reviewed this encounter and updated as appropriate:  Tobacco  Allergies  Meds  Problems  Med Hx  Surg Hx  Fam Hx     Review of Systems:  No other skin or systemic complaints except as noted in HPI or Assessment and Plan.  Objective  Well appearing patient in no apparent distress; mood and affect are within normal limits.  A full examination was performed including scalp, head, eyes, ears, nose, lips, neck, chest, axillae, abdomen, back, buttocks, bilateral upper extremities, bilateral lower extremities, hands, feet, fingers, toes, fingernails, and toenails. All findings within normal limits unless otherwise noted below.  Objective  Face, scalp and ears x 5 (5): Erythematous thin papules/macules with gritty scale.   Objective  trunk, scalp, L ant waistline (7): Erythematous keratotic or waxy stuck-on papule or plaque.   Objective  L clavicle: Hyperkeratotic papule 1.0 cm   Objective  B/L lower leg: Stasis changes   Assessment & Plan  AK (actinic keratosis) (5) Face, scalp and ears x 5  Liquid Nitrogen Destruction of lesion (x5) - Face, scalp and ears x 5  Inflamed seborrheic keratosis (7) trunk, scalp, L ant waistline  If L ant waistline ISK not resolved bx at f/u appt.  Destruction of lesion - trunk, scalp, L ant waistline Complexity: simple   Destruction method: cryotherapy   Informed consent: discussed and consent obtained   Timeout:  patient name, date of birth, surgical site, and procedure verified Lesion destroyed using liquid nitrogen: Yes   Region frozen until ice ball extended beyond lesion: Yes     Outcome: patient tolerated procedure well with no complications   Post-procedure details: wound care instructions given    Neoplasm of uncertain behavior of skin L clavicle/ neck  Epidermal / dermal shaving  Lesion length (cm):  1 Lesion width (cm):  1 Margin per side (cm):  0.2 Total excision diameter (cm):  1.4 Informed consent: discussed and consent obtained   Timeout: patient name, date of birth, surgical site, and procedure verified   Procedure prep:  Patient was prepped and draped in usual sterile fashion Prep type:  Isopropyl alcohol Anesthesia: the lesion was anesthetized in a standard fashion   Anesthetic:  1% lidocaine w/ epinephrine 1-100,000 buffered w/ 8.4% NaHCO3 Instrument used: flexible razor blade   Hemostasis achieved with: pressure, aluminum chloride and electrodesiccation   Outcome: patient tolerated procedure well   Post-procedure details: sterile dressing applied and wound care instructions given   Dressing type: bandage and petrolatum    Destruction of lesion Complexity: extensive   Destruction method: electrodesiccation and curettage   Informed consent: discussed and consent obtained   Timeout:  patient name, date of birth, surgical site, and procedure verified Procedure prep:  Patient was prepped and draped in usual sterile fashion Prep type:  Isopropyl alcohol Anesthesia: the lesion was anesthetized in a standard fashion   Anesthetic:  1% lidocaine w/ epinephrine 1-100,000 buffered w/ 8.4% NaHCO3 Curettage performed in three different directions: Yes   Electrodesiccation performed over the curetted area: Yes   Lesion length (cm):  1 Lesion width (cm):  1 Margin per side (cm):  0.2  Final wound size (cm):  1.4 Hemostasis achieved with:  pressure, aluminum chloride and electrodesiccation Outcome: patient tolerated procedure well with no complications   Post-procedure details: sterile dressing applied and wound care instructions given   Dressing type:  bandage and petrolatum    Specimen 1 - Surgical pathology Differential Diagnosis: D48.5 ISK vs SCC ED&C today  Check Margins: No  Venous stasis dermatitis of left lower extremity B/L lower leg  Benign, observe.     Lentigines - Scattered tan macules - Discussed due to sun exposure - Benign, observe - Call for any changes  Seborrheic Keratoses - Stuck-on, waxy, tan-brown papules and plaques  - Discussed benign etiology and prognosis. - Observe - Call for any changes  Melanocytic Nevi - Tan-brown and/or pink-flesh-colored symmetric macules and papules - Benign appearing on exam today - Observation - Call clinic for new or changing moles - Recommend daily use of broad spectrum spf 30+ sunscreen to sun-exposed areas.   Hemangiomas - Red papules - Discussed benign nature - Observe - Call for any changes  Actinic Damage - diffuse scaly erythematous macules with underlying dyspigmentation - Recommend daily broad spectrum sunscreen SPF 30+ to sun-exposed areas, reapply every 2 hours as needed.  - Call for new or changing lesions.  Skin cancer screening performed today.  History of Melanoma - being treated by [facility] oncology with immunotherapy/chemotherapy - No evidence of recurrence today - No lymphadenopathy - Recommend regular full body skin exams - Recommend daily broad spectrum sunscreen SPF 30+ to sun-exposed areas, reapply every 2 hours as needed.  - Call if any new or changing lesions are noted between office visits  History of Basal Cell Carcinoma of the Skin - No evidence of recurrence today - Recommend regular full body skin exams - Recommend daily broad spectrum sunscreen SPF 30+ to sun-exposed areas, reapply every 2 hours as needed.  - Call if any new or changing lesions are noted between office visits   Return in about 3 months (around 09/10/2019) for TBSE - hx MM, BCC, AK's .  Luther Redo, CMA, am acting as scribe for [name], MD  .  Documentation: I have reviewed the above documentation for accuracy and completeness, and I agree with the above.  [name], MD

## 2019-06-14 ENCOUNTER — Encounter: Payer: Self-pay | Admitting: Dermatology

## 2019-06-15 ENCOUNTER — Telehealth: Payer: Self-pay

## 2019-06-15 NOTE — Telephone Encounter (Signed)
Lft pt msg to call for bx results/sh °

## 2019-06-15 NOTE — Telephone Encounter (Signed)
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

## 2019-06-15 NOTE — Telephone Encounter (Signed)
Patient advised of biopsy results.

## 2019-09-15 DIAGNOSIS — R911 Solitary pulmonary nodule: Secondary | ICD-10-CM | POA: Insufficient documentation

## 2019-09-16 ENCOUNTER — Ambulatory Visit (INDEPENDENT_AMBULATORY_CARE_PROVIDER_SITE_OTHER): Payer: Medicare Other | Admitting: Dermatology

## 2019-09-16 ENCOUNTER — Other Ambulatory Visit: Payer: Self-pay

## 2019-09-16 DIAGNOSIS — Z85828 Personal history of other malignant neoplasm of skin: Secondary | ICD-10-CM

## 2019-09-16 DIAGNOSIS — L820 Inflamed seborrheic keratosis: Secondary | ICD-10-CM

## 2019-09-16 DIAGNOSIS — D229 Melanocytic nevi, unspecified: Secondary | ICD-10-CM

## 2019-09-16 DIAGNOSIS — L814 Other melanin hyperpigmentation: Secondary | ICD-10-CM

## 2019-09-16 DIAGNOSIS — L111 Transient acantholytic dermatosis [Grover]: Secondary | ICD-10-CM

## 2019-09-16 DIAGNOSIS — D0461 Carcinoma in situ of skin of right upper limb, including shoulder: Secondary | ICD-10-CM | POA: Diagnosis not present

## 2019-09-16 DIAGNOSIS — D1800 Hemangioma unspecified site: Secondary | ICD-10-CM

## 2019-09-16 DIAGNOSIS — Z1283 Encounter for screening for malignant neoplasm of skin: Secondary | ICD-10-CM | POA: Diagnosis not present

## 2019-09-16 DIAGNOSIS — Z85820 Personal history of malignant melanoma of skin: Secondary | ICD-10-CM | POA: Diagnosis not present

## 2019-09-16 DIAGNOSIS — L578 Other skin changes due to chronic exposure to nonionizing radiation: Secondary | ICD-10-CM

## 2019-09-16 DIAGNOSIS — D492 Neoplasm of unspecified behavior of bone, soft tissue, and skin: Secondary | ICD-10-CM

## 2019-09-16 DIAGNOSIS — L821 Other seborrheic keratosis: Secondary | ICD-10-CM

## 2019-09-16 MED ORDER — CLINDAMYCIN PHOSPHATE 1 % EX LOTN: TOPICAL_LOTION | Freq: Every day | CUTANEOUS | 2 refills | Status: AC

## 2019-09-16 NOTE — Patient Instructions (Signed)
Wound Care Instructions  1. Cleanse wound gently with soap and water once a day then pat dry with clean gauze. Apply a thing coat of Petrolatum (petroleum jelly, "Vaseline") over the wound (unless you have an allergy to this). We recommend that you use a new, sterile tube of Vaseline. Do not pick or remove scabs. Do not remove the yellow or white "healing tissue" from the base of the wound.  2. Cover the wound with fresh, clean, nonstick gauze and secure with paper tape. You may use Band-Aids in place of gauze and tape if the would is small enough, but would recommend trimming much of the tape off as there is often too much. Sometimes Band-Aids can irritate the skin.  3. You should call the office for your biopsy report after 1 week if you have not already been contacted.  4. If you experience any problems, such as abnormal amounts of bleeding, swelling, significant bruising, significant pain, or evidence of infection, please call the office immediately.  5. FOR ADULT SURGERY PATIENTS: If you need something for pain relief you may take 1 extra strength Tylenol (acetaminophen) AND 2 Ibuprofen (200mg each) together every 4 hours as needed for pain. (do not take these if you are allergic to them or if you have a reason you should not take them.) Typically, you may only need pain medication for 1 to 3 days.   Cryotherapy Aftercare  . Wash gently with soap and water everyday.   . Apply Vaseline and Band-Aid daily until healed.  Prior to procedure, discussed risks of blister formation, small wound, skin dyspigmentation, or rare scar following cryotherapy.   

## 2019-09-16 NOTE — Progress Notes (Signed)
Follow-Up Visit   Subjective  Martin Lopez is a 74 y.o. male who presents for the following: full skin exam (6 month skin exam. Hx of MM, BCC's, SCC's). The patient presents for Total-Body Skin Exam (TBSE) for skin cancer screening and mole check.  The following portions of the chart were reviewed this encounter and updated as appropriate: Tobacco  Allergies  Meds  Problems  Med Hx  Surg Hx  Fam Hx     Review of Systems: No other skin or systemic complaints except as noted in HPI or Assessment and Plan.  Objective  Well appearing patient in no apparent distress; mood and affect are within normal limits.  A full examination was performed including scalp, head, eyes, ears, nose, lips, neck, chest, axillae, abdomen, back, buttocks, bilateral upper extremities, bilateral lower extremities, hands, feet, fingers, toes, fingernails, and toenails. All findings within normal limits unless otherwise noted below.  Objective  Right deltoid: Well healed scar with no evidence of recurrence, no lymphadenopathy.  Recent MM scans normal. Hx of 8 immunotherapy Tx for MM.  Objective  Chest, abdomen, back: Red papules and papulovesicles.   Objective  Right proximal Forearm: 0.8cm keratotic papule     Objective  arms and legs x21 (21): Erythematous keratotic or waxy stuck-on papule or plaque.   Assessment & Plan    Lentigines - Scattered tan macules - Discussed due to sun exposure - Benign, observe - Call for any changes  Seborrheic Keratoses - Stuck-on, waxy, tan-brown papules and plaques  - Discussed benign etiology and prognosis. - Observe - Call for any changes  Melanocytic Nevi - Tan-brown and/or pink-flesh-colored symmetric macules and papules - Benign appearing on exam today - Observation - Call clinic for new or changing moles - Recommend daily use of broad spectrum spf 30+ sunscreen to sun-exposed areas.   Hemangiomas - Red papules - Discussed benign  nature - Observe - Call for any changes  Actinic Damage - diffuse scaly erythematous macules with underlying dyspigmentation - Recommend daily broad spectrum sunscreen SPF 30+ to sun-exposed areas, reapply every 2 hours as needed.  - Call for new or changing lesions.  Skin cancer screening performed today.  History of melanoma - Metastatic Right deltoid - S/P Wide Local Excision + lymph node biopsy Recent scans clear - reviewed studies Continue systemic immunotherapy treatments per [facility] oncology Observe. Watch for recurrence.   Grover's disease Chest, abdomen, back Continue Triamcinolone 0.1% cream BID 5 days/week PRN. Clindamycin QD 7 days/week.  clindamycin (CLEOCIN-T) 1 % lotion - Chest, abdomen, back  Neoplasm of skin Right proximal Forearm  Epidermal / dermal shaving  Lesion diameter (cm):  0.8 Informed consent: discussed and consent obtained   Timeout: patient name, date of birth, surgical site, and procedure verified   Procedure prep:  Patient was prepped and draped in usual sterile fashion Prep type:  Isopropyl alcohol Anesthesia: the lesion was anesthetized in a standard fashion   Anesthetic:  1% lidocaine w/ epinephrine 1-100,000 buffered w/ 8.4% NaHCO3 Instrument used: flexible razor blade   Hemostasis achieved with: pressure, aluminum chloride and electrodesiccation   Outcome: patient tolerated procedure well   Post-procedure details: sterile dressing applied and wound care instructions given   Dressing type: bandage and petrolatum    Destruction of lesion Complexity: extensive   Destruction method: electrodesiccation and curettage   Informed consent: discussed and consent obtained   Timeout:  patient name, date of birth, surgical site, and procedure verified Procedure prep:  Patient was prepped  and draped in usual sterile fashion Prep type:  Isopropyl alcohol Anesthesia: the lesion was anesthetized in a standard fashion   Anesthetic:  1% lidocaine w/  epinephrine 1-100,000 buffered w/ 8.4% NaHCO3 Curettage performed in three different directions: Yes   Electrodesiccation performed over the curetted area: Yes   Lesion length (cm):  0.8 Lesion width (cm):  0.8 Margin per side (cm):  0.2 Final wound size (cm):  1.2 Hemostasis achieved with:  pressure and aluminum chloride Outcome: patient tolerated procedure well with no complications   Post-procedure details: sterile dressing applied and wound care instructions given   Dressing type: bandage and petrolatum    Specimen 1 - Surgical pathology Differential Diagnosis: SCC vs AK vs ISK Check Margins: No 0.8cm keratotic papule  Inflamed seborrheic keratosis (21) arms and legs x21  Destruction of lesion - arms and legs x21 Complexity: simple   Destruction method: cryotherapy   Informed consent: discussed and consent obtained   Timeout:  patient name, date of birth, surgical site, and procedure verified Lesion destroyed using liquid nitrogen: Yes   Region frozen until ice ball extended beyond lesion: Yes   Outcome: patient tolerated procedure well with no complications   Post-procedure details: wound care instructions given    Return in about 6 months (around 03/18/2020) for full skin exam, HxMM.   I, Emelia Salisbury, CMA, am acting as scribe for [name], MD.  Documentation: I have reviewed the above documentation for accuracy and completeness, and I agree with the above.  [name], MD

## 2019-09-21 ENCOUNTER — Telehealth: Payer: Self-pay

## 2019-09-21 NOTE — Telephone Encounter (Signed)
Patient informed of pathology results 

## 2019-09-21 NOTE — Telephone Encounter (Signed)
-----   Message from Ralene Bathe, MD sent at 09/17/2019  7:35 PM EDT ----- Skin , right proximal forearm SQUAMOUS CELL CARCINOMA IN SITU, HYPERTROPHIC,  Cancer - SCC in situ Superficial Already treated Recheck next visit

## 2019-09-23 ENCOUNTER — Encounter: Payer: Self-pay | Admitting: Dermatology

## 2019-12-23 ENCOUNTER — Encounter: Payer: Self-pay | Admitting: Dermatology

## 2019-12-23 ENCOUNTER — Ambulatory Visit (INDEPENDENT_AMBULATORY_CARE_PROVIDER_SITE_OTHER): Payer: Medicare Other | Admitting: Dermatology

## 2019-12-23 ENCOUNTER — Other Ambulatory Visit: Payer: Self-pay

## 2019-12-23 DIAGNOSIS — D692 Other nonthrombocytopenic purpura: Secondary | ICD-10-CM | POA: Diagnosis not present

## 2019-12-23 DIAGNOSIS — L03116 Cellulitis of left lower limb: Secondary | ICD-10-CM | POA: Diagnosis not present

## 2019-12-23 DIAGNOSIS — L578 Other skin changes due to chronic exposure to nonionizing radiation: Secondary | ICD-10-CM

## 2019-12-23 MED ORDER — MUPIROCIN 2 % EX OINT: TOPICAL_OINTMENT | CUTANEOUS | 0 refills | Status: DC

## 2019-12-23 MED ORDER — DOXYCYCLINE HYCLATE 100 MG PO TABS: ORAL_TABLET | ORAL | 0 refills | Status: DC

## 2019-12-23 NOTE — Progress Notes (Signed)
   Follow-Up Visit   Subjective  Martin Lopez is a 74 y.o. male who presents for the following: Skin Problem (Pt presents for a new spot on the left leg that recently appeared ). Pt using otc topical arnicare gel for bruising areas on lower legs.  He is on chemotherapy for metastatic melanoma and wants to make sure this condition of his leg does not interfere with his chemotherapy.  The following portions of the chart were reviewed this encounter and updated as appropriate:  Tobacco  Allergies  Meds  Problems  Med Hx  Surg Hx  Fam Hx     Review of Systems:  No other skin or systemic complaints except as noted in HPI or Assessment and Plan.  Objective  Well appearing patient in no apparent distress; mood and affect are within normal limits.  A focused examination was performed including face, neck, chest and back and lower legs . Relevant physical exam findings are noted in the Assessment and Plan.  Objective  Left Lower Leg - Anterior: Skin erosion    Assessment & Plan    Cellulitis of left lower extremity Left Lower Leg - Anterior  Start Doxycyline 100 mg take 1 tablet po bid with food x 7 days   Start Mupirocin ointment apply to skin qd-bid prn   Ordered Medications: doxycycline (VIBRA-TABS) 100 MG tablet mupirocin ointment (BACTROBAN) 2 %   Purpura - Chronic; persistent and recurrent.  Treatable, but not curable. Cont Arnicare gel daily - Violaceous macules and patches - Benign - Related to age, sun damage and/or use of blood thinners - Observe - Can use OTC arnica containing moisturizer such as Dermend Bruise Formula if desired - Call for worsening or other concerns  Actinic Damage - chronic, secondary to cumulative UV radiation exposure/sun exposure over time - diffuse scaly erythematous macules with underlying dyspigmentation - Recommend daily broad spectrum sunscreen SPF 30+ to sun-exposed areas, reapply every 2 hours as needed.  - Call for new or  changing lesions.  Return if symptoms worsen or fail to improve.  IMarye Round, CMA, am acting as scribe for [name], MD .  Documentation: I have reviewed the above documentation for accuracy and completeness, and I agree with the above.  [name], MD

## 2019-12-24 ENCOUNTER — Ambulatory Visit: Payer: Medicare Other | Admitting: Dermatology

## 2019-12-28 ENCOUNTER — Encounter: Payer: Self-pay | Admitting: Dermatology

## 2020-01-23 DIAGNOSIS — M1990 Unspecified osteoarthritis, unspecified site: Secondary | ICD-10-CM

## 2020-01-23 HISTORY — DX: Unspecified osteoarthritis, unspecified site: M19.90

## 2020-02-15 NOTE — Progress Notes (Unsigned)
Cardiology Office Note  Date:  02/16/2020   ID:  Emmett, Bracknell Sep 25, 1945, MRN [id number]  PCP:  [name], MD   Chief Complaint  Patient presents with  . Follow-up    Annual follow up. Medications verbally reviewed with patient.     HPI:  Mr. Iyer is a very pleasant 75 year-old gentleman with a history of hypertension,  hyperlipidemia,  strong family history of coronary artery disease  Prior cardiac calcium score in 2009 of  Zero  s/p hemorrhoidectomy who presents  for routine followup of his hypertension and hyperlipidemia.   Events discussed with him Diagnosis of malignant melanoma Underwent chemo Has periodic imaging, liver and lung nodules being monitored  Discussed blood pressure measurements at home, typically 120/70 Checks it every evening  On norvasc 5, irbesartan 150 daily crestor 10 qod Would like to come off some of his medications  Active but no regular exercise program  Sluggish in AM, continues to exercise at least 30 min every morning  Total cholesterol 161 LDL 73  EKG personally reviewed by myself on todays  visit Shows normal sinus rhythm 61 bpm no significant ST or T wave changes rare PVCs  Previous CT scan of the chest showing no significant coronary calcifications, no significant plaque in the aorta   PMH:   has a past medical history of Allergy, Basal cell carcinoma (01/05/2013), Basal cell carcinoma (03/18/2013), Basal cell carcinoma (03/22/2016), Basal cell carcinoma (03/22/2016), Basal cell carcinoma (03/28/2017), Basal cell carcinoma (06/03/2017), Basal cell carcinoma (06/11/2018), Basal cell carcinoma (06/11/2018), Basal cell carcinoma (06/11/2018), Basal cell carcinoma (12/10/2018), Bone spur, Bulging disc (2013), Cancer ([place]), 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  PSH:    Past Surgical History:  Procedure Laterality Date  . BASAL CELL CARCINOMA EXCISION  2013   right leg,FOREHEAD  . COLONOSCOPY  2016  . COLONOSCOPY    . GREEN LIGHT LASER TURP (TRANSURETHRAL RESECTION OF PROSTATE N/A 09/21/2014   Procedure: GREEN LIGHT LASER TURP (TRANSURETHRAL RESECTION OF PROSTATE;  Surgeon: [name], MD;  Location: [facility] ORS;  Service: Urology;  Laterality: N/A;  . HEMORRHOID SURGERY N/A 02/28/2016   Procedure: HEMORRHOIDECTOMY;  Surgeon: [name], MD;  Location: [facility] ORS;  Service: General;  Laterality: N/A;  . TONSILLECTOMY      Current Outpatient Medications  Medication Sig Dispense Refill  . amLODipine (NORVASC) 5 MG tablet TAKE 1 TABLET DAILY 30 tablet 6  . clindamycin (CLEOCIN-T) 1 % lotion Apply topically daily. 60 mL 2  . esomeprazole (NEXIUM) 40 MG capsule Take 40 mg by mouth daily before breakfast. Takes 0.5 tablet    . hydrocortisone (ANUSOL-HC) 2.5 % rectal cream Place 1 application rectally 2 (two) times daily. As needed. 30 g 3  . ibuprofen (ADVIL,MOTRIN) 200 MG tablet Take 400 mg by mouth every 6 (six) hours as needed for mild pain or moderate pain.    Marland Kitchen irbesartan (AVAPRO) 300 MG tablet TAKE 1 TABLET DAILY 90 tablet 2  . Multiple Vitamin (MULTIVITAMIN) capsule Take 1 capsule by mouth daily.    . mupirocin ointment (BACTROBAN) 2 % Apply to skin qd-bid 22 g 0  . rosuvastatin (CRESTOR) 10 MG tablet Take 10 mg by mouth every other day.     . sildenafil (VIAGRA) 100 MG tablet Take 100 mg by mouth as needed.    . terazosin (HYTRIN) 10  MG capsule Take 1 capsule (10 mg total) by mouth at bedtime.    . triamcinolone cream (KENALOG) 0.1 % Apply 1 application topically 2 (two) times daily as needed. 454 g 2  . valACYclovir (VALTREX) 1000 MG tablet Take 1 tablet (1,000 mg total) by mouth as directed. Take 2 PO at onset and 2 PO 12 hours later 30 tablet 6  . zolpidem (AMBIEN) 5 MG tablet Take 5 mg by mouth at  bedtime as needed for sleep. Pt takes 1/2 tablet each night     No current facility-administered medications for this visit.     Allergies:   Patient has no known allergies.   Social History:  The patient  reports that he has never smoked. He has never used smokeless tobacco. He reports current alcohol use of about 1.0 standard drink of alcohol per week. He reports that he does not use drugs.   Family History:   family history includes Cancer in his father; Coronary artery disease in his maternal uncle; Ovarian cancer in his mother.    Review of Systems: Review of Systems  Constitutional: Negative.   HENT: Negative.   Respiratory: Negative.   Cardiovascular: Negative.   Gastrointestinal: Positive for diarrhea.  Musculoskeletal: Positive for joint pain.  Neurological: Negative.   Psychiatric/Behavioral: Negative.   All other systems reviewed and are negative.   PHYSICAL EXAM: VS:  BP 120/78 (BP Location: Left Arm, Patient Position: Sitting, Cuff Size: Normal)   Pulse 61   Ht 5\' 8"  (1.727 m)   Wt 167 lb (75.8 kg)   SpO2 97%   BMI 25.39 kg/m  , BMI Body mass index is 25.39 kg/m. Constitutional:  oriented to person, place, and time. No distress.  HENT:  Head: Grossly normal Eyes:  no discharge. No scleral icterus.  Neck: No JVD, no carotid bruits  Cardiovascular: Regular rate and rhythm, no murmurs appreciated Pulmonary/Chest: Clear to auscultation bilaterally, no wheezes or rails Abdominal: Soft.  no distension.  no tenderness.  Musculoskeletal: Normal range of motion Neurological:  normal muscle tone. Coordination normal. No atrophy Skin: Skin warm and dry Psychiatric: normal affect, pleasant   Recent Labs: No results found for requested labs within last 8760 hours.    Lipid Panel No results found for: CHOL, HDL, LDLCALC, TRIG    Wt Readings from Last 3 Encounters:  02/16/20 167 lb (75.8 kg)  12/01/18 170 lb 12.8 oz (77.5 kg)  10/07/18 171 lb (77.6 kg)       ASSESSMENT AND PLAN:  HYPERTENSION, BENIGN - Recent weight loss, concern his blood pressure may be running low at times Recommend he hold amlodipine for now monitor blood pressure at home Could always add amlodipine full or half dose back if blood pressure runs high  Mixed hyperlipidemia  CT scan from 2012 showing minimal aortic plaque, minimal coronary calcification if any. Cholesterol at goal  Melanoma Discussed recent events Followed at [facility], underwent chemotherapy lymph node positive Overall in good spirits  Diarrhea Prior history, no significant change   Total encounter time more than 25 minutes  Greater than 50% was spent in counseling and coordination of care with the patient    No orders of the defined types were placed in this encounter.    Signed, Esmond Plants, M.D., Ph.D. 02/16/2020  [place], [place]

## 2020-02-16 ENCOUNTER — Encounter: Payer: Self-pay | Admitting: Cardiovascular Disease

## 2020-02-16 ENCOUNTER — Other Ambulatory Visit: Payer: Self-pay

## 2020-02-16 ENCOUNTER — Ambulatory Visit (INDEPENDENT_AMBULATORY_CARE_PROVIDER_SITE_OTHER): Payer: Medicare Other | Admitting: Cardiovascular Disease

## 2020-02-16 VITALS — BP 120/78 | HR 61 | Ht 68.0 in | Wt 167.0 lb

## 2020-02-16 DIAGNOSIS — E782 Mixed hyperlipidemia: Secondary | ICD-10-CM | POA: Diagnosis not present

## 2020-02-16 DIAGNOSIS — I10 Essential (primary) hypertension: Secondary | ICD-10-CM | POA: Diagnosis not present

## 2020-02-16 DIAGNOSIS — C4360 Malignant melanoma of unspecified upper limb, including shoulder: Secondary | ICD-10-CM | POA: Diagnosis not present

## 2020-02-16 NOTE — Patient Instructions (Addendum)
Medication Instructions:  You may take amlodipine PRN (as needed) for BP Take blood pressure and monitor for pressures 135-140 (top number). Take amlodipine  Lab work: No new labs needed  Testing/Procedures: No new testing needed   Follow-Up: At [facility], you and your health needs are our priority.  As part of our continuing mission to provide you with exceptional heart care, we have created designated Provider Care Teams.  These Care Teams include your primary Cardiologist (physician) and Advanced Practice Providers (APPs -  Physician Assistants and Nurse Practitioners) who all work together to provide you with the care you need, when you need it.  . You will need a follow up appointment in 12 months  . Providers on your designated Care Team:   . Murray Hodgkins, NP . Christell Faith, PA-C . Marrianne Mood, PA-C  Any Other Special Instructions Will Be Listed Below (If Applicable).  COVID-19 Vaccine Information can be found at: [email or web address] For questions related to vaccine distribution or appointments, please email [email or web address] or call [phone].    Please monitor blood pressures and keep a log of your readings.  Call the clinic if BP remains elevated   How to use a home blood pressure monitor. . Be still. . Measure at the same time every day. It's important to take the readings at the same time each day, such as morning and evening. Take reading approximately 1 1/2 to 2 hours after BP medications.   AVOID these things for 30 minutes before checking your blood pressure:  Drinking caffeine.  Drinking alcohol.  Eating.  Smoking.  Exercising.   Five minutes before checking your blood pressure:  Pee.  Sit in a dining chair. Avoid sitting in a soft couch or armchair.  Be quiet. Do not talk.       Sit correctly. Sit with your back straight and supported (on a dining chair, rather than a  sofa). Your feet should be flat on the floor and your legs should not be crossed. Your arm should be supported on a flat surface (such as a table) with the upper arm at heart level. Make sure the bottom of the cuff is placed directly above the bend of the elbow.

## 2020-03-01 ENCOUNTER — Other Ambulatory Visit: Payer: Self-pay | Admitting: Cardiovascular Disease

## 2020-03-21 ENCOUNTER — Telehealth: Payer: Self-pay | Admitting: Cardiovascular Disease

## 2020-03-21 NOTE — Telephone Encounter (Signed)
Pt c/o of Chest Pain: STAT if CP now or developed within 24 hours  1. Are you having CP right now? Yes, but mild  2. Are you experiencing any other symptoms (ex. SOB, nausea, vomiting, sweating)? Slight shortness of breath  3. How long have you been experiencing CP? Since mid day Friday  4. Is your CP continuous or coming and going? "moves about in my chest" started in left side center torso and moved right sided chest area  5. Have you taken Nitroglycerin? no ?

## 2020-03-21 NOTE — Telephone Encounter (Signed)
Spoke to patient. Patient reports on Friday mid-day that he started having pain in his mid to left chest that then moved to the right chest and back between shoulder blades. It is dull and pain level "1-2" out of 10. It is constant and dull. Reports he is belching a lot and feels like he needs to have a large belch and he would feel better. Feels a little short of breath. Other thing different from usual is sneezing. He usually sneezes several times every morning. Friday, Saturday and Sunday - he felt the sneeze wanting to come but then his body would abort and be unable to finish. Strangely, in the afternoon he was then able to sneeze. Denies dizziness, N/V, or the feeling of heaviness on chest.   History of melanoma of the skin; just finished immunotherapy last month. Has repeat GI procedure (EGD) for 6 month f/u tomorrow at [facility]. Advised him I would discuss with Dr [name] and let him know his advice.

## 2020-03-21 NOTE — Telephone Encounter (Signed)
Discussed with Dr [name] about patient symptoms and complaints. Advised that the pain seems more atypical and GI in nature. Advises patient to take Tums, pepcid and drink some carbonated soda to see if that helps. Advises to go through with EGD tomorrow and let us know if the symptoms persist after that.  Patient verbalized understanding. He would still like to see Dr [name] this week for evaluation. Patient added to appointment tomorrow afternoon per his request.

## 2020-03-21 NOTE — Progress Notes (Signed)
Cardiology Office Note  Date:  03/22/2020   ID:  Martin Lopez, Martin Lopez [birth year], MRN [id number]  PCP:  [name], MD   Chief Complaint  Patient presents with  . Other    Chest pain/sob. Meds reviewed verbally with pt.    HPI:  Martin Lopez is a very pleasant 75 year-old gentleman with a history of hypertension,  hyperlipidemia,  strong family history of coronary artery disease  Prior cardiac calcium score in 2009 of  Zero  s/p hemorrhoidectomy who presents  for routine followup of his hypertension and hyperlipidemia.   Continues to exercise on his treadmill without any chest pain or anginal symptoms  Past several days, woke up with some Back pain, to left flank discomfort lasting several days, hurts to take a breath in Several days later, , "chest wall" mediastinal tenderness, again could appreciated on a deep breath in Can't burp, trouble sneezing (now able to sneeze) Feels like if he could burp it would help his symptoms away Took a Pepcid, feels like it helped his symptoms  Has EGD tomorrow for further evaluation of GI symptoms Has CT scan in 2 weeks time for follow-up of his malignant melanoma, nodules Completed chemotherapy  Otherwise doing well on his medications, blood pressure work  Lab work reviewed Total cholesterol 161 LDL 73  EKG personally reviewed by myself on todays  visit Shows normal sinus rhythm 59 bpm no significant ST or T wave changes rare PVCs  Previous CT scan of the chest showing no significant coronary calcifications, no significant plaque in the aorta   PMH:   has a past medical history of Allergy, Basal cell carcinoma (01/05/2013), Basal cell carcinoma (03/18/2013), Basal cell carcinoma (03/22/2016), Basal cell carcinoma (03/22/2016), Basal cell carcinoma (03/28/2017), Basal cell carcinoma (06/03/2017), Basal cell carcinoma (06/11/2018), Basal cell carcinoma (06/11/2018), Basal cell carcinoma (06/11/2018), Basal cell carcinoma (12/10/2018),  Bone spur, Bulging disc (2013), Cancer ([place]), 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  PSH:    Past Surgical History:  Procedure Laterality Date  . BASAL CELL CARCINOMA EXCISION  2013   right leg,FOREHEAD  . COLONOSCOPY  2016  . COLONOSCOPY    . GREEN LIGHT LASER TURP (TRANSURETHRAL RESECTION OF PROSTATE N/A 09/21/2014   Procedure: GREEN LIGHT LASER TURP (TRANSURETHRAL RESECTION OF PROSTATE;  Surgeon: [name], MD;  Location: [facility] ORS;  Service: Urology;  Laterality: N/A;  . HEMORRHOID SURGERY N/A 02/28/2016   Procedure: HEMORRHOIDECTOMY;  Surgeon: [name], MD;  Location: [facility] ORS;  Service: General;  Laterality: N/A;  . TONSILLECTOMY      Current Outpatient Medications  Medication Sig Dispense Refill  . amLODipine (NORVASC) 5 MG tablet TAKE 1 TABLET DAILY 90 tablet 0  . clindamycin (CLEOCIN-T) 1 % lotion Apply topically daily. 60 mL 2  . esomeprazole (NEXIUM) 40 MG capsule Take 40 mg by mouth daily before breakfast. Takes 0.5 tablet    . hydrocortisone (ANUSOL-HC) 2.5 % rectal cream Place 1 application rectally 2 (two) times daily. As needed. 30 g 3  . ibuprofen (ADVIL,MOTRIN) 200 MG tablet Take 400 mg by mouth every 6 (six) hours as needed for mild pain or moderate pain.    Marland Kitchen irbesartan (AVAPRO) 300 MG tablet TAKE 1 TABLET DAILY 90 tablet 2  . Multiple Vitamin (MULTIVITAMIN) capsule Take 1 capsule by mouth daily.    . mupirocin ointment (BACTROBAN) 2 %  Apply to skin qd-bid 22 g 0  . rosuvastatin (CRESTOR) 10 MG tablet Take 10 mg by mouth every other day.     . sildenafil (VIAGRA) 100 MG tablet Take 100 mg by mouth as needed.    . terazosin (HYTRIN) 10 MG capsule Take 1 capsule (10 mg total) by mouth at bedtime.    . triamcinolone cream (KENALOG) 0.1 % Apply 1 application  topically 2 (two) times daily as needed. 454 g 2  . valACYclovir (VALTREX) 1000 MG tablet Take 1 tablet (1,000 mg total) by mouth as directed. Take 2 PO at onset and 2 PO 12 hours later 30 tablet 6  . zolpidem (AMBIEN) 5 MG tablet Take 5 mg by mouth at bedtime as needed for sleep. Pt takes 1/2 tablet each night     No current facility-administered medications for this visit.     Allergies:   Patient has no known allergies.   Social History:  The patient  reports that he has never smoked. He has never used smokeless tobacco. He reports current alcohol use of about 1.0 standard drink of alcohol per week. He reports that he does not use drugs.   Family History:   family history includes Cancer in his father; Coronary artery disease in his maternal uncle; Ovarian cancer in his mother.    Review of Systems: Review of Systems  Constitutional: Negative.   HENT: Negative.   Respiratory: Negative.   Cardiovascular: Negative.   Gastrointestinal: Positive for heartburn.  Musculoskeletal: Negative.   Neurological: Negative.   Psychiatric/Behavioral: Negative.   All other systems reviewed and are negative.   PHYSICAL EXAM: VS:  BP 122/80 (BP Location: Left Arm, Patient Position: Sitting, Cuff Size: Normal)   Pulse (!) 59   Ht 5\' 8"  (1.727 m)   Wt 168 lb 4 oz (76.3 kg)   SpO2 98%   BMI 25.58 kg/m  , BMI Body mass index is 25.58 kg/m. Constitutional:  oriented to person, place, and time. No distress.  HENT:  Head: Grossly normal Eyes:  no discharge. No scleral icterus.  Neck: No JVD, no carotid bruits  Cardiovascular: Regular rate and rhythm, no murmurs appreciated Pulmonary/Chest: Clear to auscultation bilaterally, no wheezes or rails Abdominal: Soft.  no distension.  no tenderness.  Musculoskeletal: Normal range of motion Neurological:  normal muscle tone. Coordination normal. No atrophy Skin: Skin warm and dry Psychiatric: normal affect, pleasant   Recent Labs: No results  found for requested labs within last 8760 hours.    Lipid Panel No results found for: CHOL, HDL, LDLCALC, TRIG    Wt Readings from Last 3 Encounters:  03/22/20 168 lb 4 oz (76.3 kg)  02/16/20 167 lb (75.8 kg)  12/01/18 170 lb 12.8 oz (77.5 kg)      ASSESSMENT AND PLAN:  GERD On Nexium, Unable to exclude hiatal hernia symptoms Also with some back discomfort, flank discomfort, unable to exclude musculoskeletal issue -Treadmill in with no anginal symptoms No further cardiac work-up needed prior to EGD tomorrow  HYPERTENSION, BENIGN - Blood pressure is well controlled on today's visit. No changes made to the medications.  Mixed hyperlipidemia  CT scan from 2012 showing minimal aortic plaque, minimal coronary calcification if any. Lipids at goal  Melanoma Followed at [facility], underwent chemotherapy lymph node positive Has follow-up CT scan scheduled in 2 weeks time We will review for any coronary calcification when data available  Diarrhea Stable   Total encounter time more than 25 minutes  Greater than 50%  was spent in counseling and coordination of care with the patient    Orders Placed This Encounter  Procedures  . EKG 12-Lead     Signed, Esmond Plants, M.D., Ph.D. 03/22/2020  [place], [place]

## 2020-03-22 ENCOUNTER — Other Ambulatory Visit: Payer: Self-pay

## 2020-03-22 ENCOUNTER — Encounter: Payer: Self-pay | Admitting: Cardiovascular Disease

## 2020-03-22 ENCOUNTER — Ambulatory Visit (INDEPENDENT_AMBULATORY_CARE_PROVIDER_SITE_OTHER): Payer: Medicare Other | Admitting: Cardiovascular Disease

## 2020-03-22 VITALS — BP 122/80 | HR 59 | Ht 68.0 in | Wt 168.2 lb

## 2020-03-22 DIAGNOSIS — K529 Noninfective gastroenteritis and colitis, unspecified: Secondary | ICD-10-CM

## 2020-03-22 DIAGNOSIS — I10 Essential (primary) hypertension: Secondary | ICD-10-CM | POA: Diagnosis not present

## 2020-03-22 DIAGNOSIS — C4360 Malignant melanoma of unspecified upper limb, including shoulder: Secondary | ICD-10-CM | POA: Diagnosis not present

## 2020-03-22 DIAGNOSIS — E782 Mixed hyperlipidemia: Secondary | ICD-10-CM

## 2020-03-22 DIAGNOSIS — R079 Chest pain, unspecified: Secondary | ICD-10-CM

## 2020-03-22 NOTE — Patient Instructions (Signed)
Medication Instructions:  No changes  If you need a refill on your cardiac medications before your next appointment, please call your pharmacy.    Lab work: No new labs needed   If you have labs (blood work) drawn today and your tests are completely normal, you will receive your results only by: . MyChart Message (if you have MyChart) OR . A paper copy in the mail If you have any lab test that is abnormal or we need to change your treatment, we will call you to review the results.   Testing/Procedures: No new testing needed   Follow-Up: At [facility], you and your health needs are our priority.  As part of our continuing mission to provide you with exceptional heart care, we have created designated Provider Care Teams.  These Care Teams include your primary Cardiologist (physician) and Advanced Practice Providers (APPs -  Physician Assistants and Nurse Practitioners) who all work together to provide you with the care you need, when you need it.  . You will need a follow up appointment in 12 months  . Providers on your designated Care Team:   . Christopher Berge, NP . Ryan Dunn, PA-C . Jacquelyn Visser, PA-C  Any Other Special Instructions Will Be Listed Below (If Applicable).  COVID-19 Vaccine Information can be found at: [email or web address] For questions related to vaccine distribution or appointments, please email [email or web address] or call [phone].     

## 2020-03-23 ENCOUNTER — Encounter: Payer: Medicare Other | Admitting: Dermatology

## 2020-03-25 ENCOUNTER — Telehealth: Payer: Self-pay

## 2020-03-25 NOTE — Telephone Encounter (Signed)
Mr. Sawyer dropped off his recent report from [facility] of his upper endoscopy for Dr. [name] to review. Envelope of report laid on Dr. Donivan Scull desk. Mr. Vancott also reports upcoming CT scan of abd as well.

## 2020-04-04 ENCOUNTER — Encounter: Payer: Self-pay | Admitting: Cardiovascular Disease

## 2020-04-13 ENCOUNTER — Encounter: Payer: Medicare Other | Admitting: Dermatology

## 2020-05-17 ENCOUNTER — Other Ambulatory Visit: Payer: Self-pay | Admitting: Cardiovascular Disease

## 2020-05-17 NOTE — Telephone Encounter (Signed)
Rx request sent to pharmacy.  

## 2020-05-25 ENCOUNTER — Other Ambulatory Visit: Payer: Self-pay

## 2020-05-25 ENCOUNTER — Ambulatory Visit (INDEPENDENT_AMBULATORY_CARE_PROVIDER_SITE_OTHER): Payer: Medicare Other | Admitting: Dermatology

## 2020-05-25 ENCOUNTER — Encounter: Payer: Self-pay | Admitting: Dermatology

## 2020-05-25 DIAGNOSIS — L03116 Cellulitis of left lower limb: Secondary | ICD-10-CM

## 2020-05-25 DIAGNOSIS — D492 Neoplasm of unspecified behavior of bone, soft tissue, and skin: Secondary | ICD-10-CM

## 2020-05-25 DIAGNOSIS — S60561A Insect bite (nonvenomous) of right hand, initial encounter: Secondary | ICD-10-CM

## 2020-05-25 DIAGNOSIS — L814 Other melanin hyperpigmentation: Secondary | ICD-10-CM

## 2020-05-25 DIAGNOSIS — R238 Other skin changes: Secondary | ICD-10-CM | POA: Diagnosis not present

## 2020-05-25 DIAGNOSIS — Z1283 Encounter for screening for malignant neoplasm of skin: Secondary | ICD-10-CM | POA: Diagnosis not present

## 2020-05-25 DIAGNOSIS — L821 Other seborrheic keratosis: Secondary | ICD-10-CM

## 2020-05-25 DIAGNOSIS — C44319 Basal cell carcinoma of skin of other parts of face: Secondary | ICD-10-CM

## 2020-05-25 DIAGNOSIS — M720 Palmar fascial fibromatosis [Dupuytren]: Secondary | ICD-10-CM

## 2020-05-25 DIAGNOSIS — L578 Other skin changes due to chronic exposure to nonionizing radiation: Secondary | ICD-10-CM

## 2020-05-25 DIAGNOSIS — L570 Actinic keratosis: Secondary | ICD-10-CM

## 2020-05-25 DIAGNOSIS — W57XXXA Bitten or stung by nonvenomous insect and other nonvenomous arthropods, initial encounter: Secondary | ICD-10-CM

## 2020-05-25 DIAGNOSIS — L820 Inflamed seborrheic keratosis: Secondary | ICD-10-CM

## 2020-05-25 DIAGNOSIS — Z85820 Personal history of malignant melanoma of skin: Secondary | ICD-10-CM

## 2020-05-25 DIAGNOSIS — D229 Melanocytic nevi, unspecified: Secondary | ICD-10-CM

## 2020-05-25 DIAGNOSIS — L209 Atopic dermatitis, unspecified: Secondary | ICD-10-CM

## 2020-05-25 DIAGNOSIS — D1800 Hemangioma unspecified site: Secondary | ICD-10-CM

## 2020-05-25 DIAGNOSIS — Z85828 Personal history of other malignant neoplasm of skin: Secondary | ICD-10-CM

## 2020-05-25 MED ORDER — MUPIROCIN 2 % EX OINT: TOPICAL_OINTMENT | CUTANEOUS | 0 refills | Status: DC

## 2020-05-25 NOTE — Progress Notes (Signed)
Follow-Up Visit   Subjective  Martin Lopez is a 75 y.o. male who presents for the following: Annual Exam (full skin exam 6 month skin exam. Hx of MM right distal deltoid, BCC's, SCC's.) and Skin Problem (Check spot on right cheek. Dur: several weeks. Has bled.). 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 Bx and [facility] 09/16/2019  The patient presents for Total-Body Skin Exam (TBSE) for skin cancer screening and mole check.  The following portions of the chart were reviewed this encounter and updated as appropriate:  Tobacco  Allergies  Meds  Problems  Med Hx  Surg Hx  Fam Hx     Review of Systems: No other skin or systemic complaints except as noted in HPI or Assessment and Plan.  Objective  Well appearing patient in no apparent distress; mood and affect are within normal limits.  A full examination was performed including scalp, head, eyes, ears, nose, lips, neck, chest, axillae, abdomen, back, buttocks, bilateral upper extremities, bilateral lower extremities, hands, feet, fingers, toes, fingernails, and toenails. All findings within normal limits unless otherwise noted below.  Objective  Left Ear: Violaceous lesion  Objective  Right Zygomatic Area: 1.5cm Poorly differentiated pink patch     Objective  face, hands, right ear x9 (9): Erythematous thin papules/macules with gritty scale. Erythematous thin papules/macules with gritty scale.   Objective  torso x2 (2): Erythematous keratotic or waxy stuck-on papule or plaque.   Objective  Left Abdomen (side) - Upper: Scaly erythematous papules and plaques +/- edema and vesiculation.   Objective  Right Hand - Anterior: Subcutaneous nodule  Objective  Right palm: Erythematous papule  Assessment & Plan  Venous lake Left Ear  Benign, observe.    Neoplasm of skin Right Zygomatic Area  Skin / nail biopsy Type of biopsy: tangential   Informed consent: discussed and consent obtained   Timeout: patient name,  date of birth, surgical site, and procedure verified   Procedure prep:  Patient was prepped and draped in usual sterile fashion Prep type:  Isopropyl alcohol Anesthesia: the lesion was anesthetized in a standard fashion   Anesthetic:  1% lidocaine w/ epinephrine 1-100,000 buffered w/ 8.4% NaHCO3 Instrument used: flexible razor blade   Hemostasis achieved with: pressure, aluminum chloride and electrodesiccation   Outcome: patient tolerated procedure well   Post-procedure details: sterile dressing applied and wound care instructions given   Dressing type: bandage and petrolatum    Specimen 1 - Surgical pathology Differential Diagnosis: BCC  Check Margins: No 1.5cm Poorly differentiated pink patch  AK (actinic keratosis) (9) face, hands, right ear x9  Destruction of lesion - face, hands, right ear x9 Complexity: simple   Destruction method: cryotherapy   Informed consent: discussed and consent obtained   Timeout:  patient name, date of birth, surgical site, and procedure verified Lesion destroyed using liquid nitrogen: Yes   Region frozen until ice ball extended beyond lesion: Yes   Outcome: patient tolerated procedure well with no complications   Post-procedure details: wound care instructions given    Inflamed seborrheic keratosis (2) torso x2  Destruction of lesion - torso x2 Complexity: simple   Destruction method: cryotherapy   Informed consent: discussed and consent obtained   Timeout:  patient name, date of birth, surgical site, and procedure verified Lesion destroyed using liquid nitrogen: Yes   Region frozen until ice ball extended beyond lesion: Yes   Outcome: patient tolerated procedure well with no complications   Post-procedure details: wound care instructions given  Atopic dermatitis -persistent Left Abdomen (side) - Upper Use Triamcinolone 0.1% cream BID PRN, patient has at home, will call for refills. Atopic dermatitis (eczema) is a chronic, relapsing,  pruritic condition that can significantly affect quality of life. It is often associated with allergic rhinitis and/or asthma and can require treatment with topical medications, phototherapy, or in severe cases a biologic medication called Dupixent in older children and adults.   Dupuytren's contracture of right hand Right Hand - Anterior Discussed condition  recommend evaluation by orthopedic surgeon/hand surgeon  Bug bite with inflammation Right palm Mupirocin oint. TID Reordered Medications mupirocin ointment (BACTROBAN) 2 %  Skin cancer screening   History of melanoma - Metastatic Right deltoid - S/P Wide Local Excision + lymph node biopsy Recent scans clear - reviewed studies today in the medical records. Finished immunotherapy treatments at [facility] oncology, Feb. 8 2022. Continue current staging studies. Dr. Harriet Masson Observe. Watch for recurrence.   Lentigines - Scattered tan macules - Due to sun exposure - Benign-appering, observe - Recommend daily broad spectrum sunscreen SPF 30+ to sun-exposed areas, reapply every 2 hours as needed. - Call for any changes  Seborrheic Keratoses - Stuck-on, waxy, tan-brown papules and/or plaques  - Benign-appearing - Discussed benign etiology and prognosis. - Observe - Call for any changes  Melanocytic Nevi - Tan-brown and/or pink-flesh-colored symmetric macules and papules - Benign appearing on exam today - Observation - Call clinic for new or changing moles - Recommend daily use of broad spectrum spf 30+ sunscreen to sun-exposed areas.   Hemangiomas - Red papules - Discussed benign nature - Observe - Call for any changes  Actinic Damage - Chronic condition, secondary to cumulative UV/sun exposure - diffuse scaly erythematous macules with underlying dyspigmentation - Recommend daily broad spectrum sunscreen SPF 30+ to sun-exposed areas, reapply every 2 hours as needed.  - Staying in the shade or wearing long sleeves, sun  glasses (UVA+UVB protection) and wide brim hats (4-inch brim around the entire circumference of the hat) are also recommended for sun protection.  - Call for new or changing lesions.  Skin cancer screening performed today.  History of Squamous Cell Carcinoma of the Skin - No evidence of recurrence today - No lymphadenopathy - Recommend regular full body skin exams - Recommend daily broad spectrum sunscreen SPF 30+ to sun-exposed areas, reapply every 2 hours as needed.  - Call if any new or changing lesions are noted between office visits   History of Basal Cell Carcinoma of the Skin - No evidence of recurrence today - Recommend regular full body skin exams - Recommend daily broad spectrum sunscreen SPF 30+ to sun-exposed areas, reapply every 2 hours as needed.  - Call if any new or changing lesions are noted between office visits  Return for TBSE, HxMM.   I, Emelia Salisbury, CMA, am acting as scribe for [name], MD.  Documentation: I have reviewed the above documentation for accuracy and completeness, and I agree with the above.  [name], MD

## 2020-05-25 NOTE — Patient Instructions (Signed)
Wound Care Instructions  1. Cleanse wound gently with soap and water once a day then pat dry with clean gauze. Apply a thing coat of Petrolatum (petroleum jelly, "Vaseline") over the wound (unless you have an allergy to this). We recommend that you use a new, sterile tube of Vaseline. Do not pick or remove scabs. Do not remove the yellow or white "healing tissue" from the base of the wound.  2. Cover the wound with fresh, clean, nonstick gauze and secure with paper tape. You may use Band-Aids in place of gauze and tape if the would is small enough, but would recommend trimming much of the tape off as there is often too much. Sometimes Band-Aids can irritate the skin.  3. You should call the office for your biopsy report after 1 week if you have not already been contacted.  4. If you experience any problems, such as abnormal amounts of bleeding, swelling, significant bruising, significant pain, or evidence of infection, please call the office immediately.  5. FOR ADULT SURGERY PATIENTS: If you need something for pain relief you may take 1 extra strength Tylenol (acetaminophen) AND 2 Ibuprofen (200mg  each) together every 4 hours as needed for pain. (do not take these if you are allergic to them or if you have a reason you should not take them.) Typically, you may only need pain medication for 1 to 3 days.    Melanoma ABCDEs  Melanoma is the most dangerous type of skin cancer, and is the leading cause of death from skin disease.  You are more likely to develop melanoma if you:  Have light-colored skin, light-colored eyes, or red or blond hair  Spend a lot of time in the sun  Tan regularly, either outdoors or in a tanning bed  Have had blistering sunburns, especially during childhood  Have a close family member who has had a melanoma  Have atypical moles or large birthmarks  Early detection of melanoma is key since treatment is typically straightforward and cure rates are extremely high if  we catch it early.   The first sign of melanoma is often a change in a mole or a new dark spot.  The ABCDE system is a way of remembering the signs of melanoma.  A for asymmetry:  The two halves do not match. B for border:  The edges of the growth are irregular. C for color:  A mixture of colors are present instead of an even brown color. D for diameter:  Melanomas are usually (but not always) greater than 77mm - the size of a pencil eraser. E for evolution:  The spot keeps changing in size, shape, and color.  Please check your skin once per month between visits. You can use a small mirror in front and a large mirror behind you to keep an eye on the back side or your body.   If you see any new or changing lesions before your next follow-up, please call to schedule a visit.  Please continue daily skin protection including broad spectrum sunscreen SPF 30+ to sun-exposed areas, reapplying every 2 hours as needed when you're outdoors.   Staying in the shade or wearing long sleeves, sun glasses (UVA+UVB protection) and wide brim hats (4-inch brim around the entire circumference of the hat) are also recommended for sun protection.   If you have any questions or concerns for your doctor, please call our main line at [phone] and press option 4 to reach your doctor's medical assistant. If no  one answers, please leave a voicemail as directed and we will return your call as soon as possible. Messages left after 4 pm will be answered the following business day.   You may also send [place] a message via [place]. We typically respond to MyChart messages within 1-2 business days.  For prescription refills, please ask your pharmacy to contact our office. Our fax number is [phone].  If you have an urgent issue when the clinic is closed that cannot wait until the next business day, you can page your doctor at the number below.    Please note that while we do our best to be available for urgent issues  outside of office hours, we are not available 24/7.   If you have an urgent issue and are unable to reach [place], you may choose to seek medical care at your doctor's office, retail clinic, urgent care center, or emergency room.  If you have a medical emergency, please immediately call 911 or go to the emergency department.  Pager Numbers  - Dr. Nehemiah Massed: [phone]  - Dr. Laurence Ferrari: [phone]  - Dr. Nicole Kindred: [phone]  In the event of inclement weather, please call our main line at [phone] for an update on the status of any delays or closures.  Dermatology Medication Tips: Please keep the boxes that topical medications come in in order to help keep track of the instructions about where and how to use these. Pharmacies typically print the medication instructions only on the boxes and not directly on the medication tubes.   If your medication is too expensive, please contact our office at [phone] option 4 or send [place] a message through [place].   We are unable to tell what your co-pay for medications will be in advance as this is different depending on your insurance coverage. However, we may be able to find a substitute medication at lower cost or fill out paperwork to get insurance to cover a needed medication.   If a prior authorization is required to get your medication covered by your insurance company, please allow [place] 1-2 business days to complete this process.  Drug prices often vary depending on where the prescription is filled and some pharmacies may offer cheaper prices.  The website www.goodrx.com contains coupons for medications through different pharmacies. The prices here do not account for what the cost may be with help from insurance (it may be cheaper with your insurance), but the website can give you the price if you did not use any insurance.  - You can print the associated coupon and take it with your prescription to the pharmacy.  - You may also stop by our  office during regular business hours and pick up a GoodRx coupon card.  - If you need your prescription sent electronically to a different pharmacy, notify our office through [facility] or by phone at [phone] option 4.  Topical steroids (such as triamcinolone, fluocinolone, fluocinonide, mometasone, clobetasol, halobetasol, betamethasone, hydrocortisone) can cause thinning and lightening of the skin if they are used for too long in the same area. Your physician has selected the right strength medicine for your problem and area affected on the body. Please use your medication only as directed by your physician to prevent side effects.

## 2020-05-28 ENCOUNTER — Encounter: Payer: Self-pay | Admitting: Dermatology

## 2020-06-02 ENCOUNTER — Other Ambulatory Visit: Payer: Self-pay

## 2020-06-02 ENCOUNTER — Telehealth: Payer: Self-pay

## 2020-06-02 DIAGNOSIS — C44319 Basal cell carcinoma of skin of other parts of face: Secondary | ICD-10-CM

## 2020-06-02 NOTE — Telephone Encounter (Signed)
Discussed biopsy results with pt we will refer for Mohs with Dr Lacinda Axon in [facility]

## 2020-06-02 NOTE — Progress Notes (Signed)
Mohs referral sent to Dr Lacinda Axon

## 2020-06-02 NOTE — Telephone Encounter (Signed)
-----   Message from Ralene Bathe, MD sent at 05/30/2020  7:24 PM EDT ----- Diagnosis Skin , right zygomatic area BASAL CELL CARCINOMA, NODULAR PATTERN, BASE INVOLVED  Cancer - BCC Schedule MOHS

## 2020-07-06 ENCOUNTER — Ambulatory Visit: Payer: Medicare Other | Admitting: Dermatology

## 2020-08-04 ENCOUNTER — Ambulatory Visit: Payer: Medicare Other | Admitting: Dermatology

## 2020-09-20 DIAGNOSIS — Z85828 Personal history of other malignant neoplasm of skin: Secondary | ICD-10-CM | POA: Insufficient documentation

## 2020-09-21 ENCOUNTER — Encounter: Payer: Self-pay | Admitting: Dermatology

## 2020-09-21 ENCOUNTER — Other Ambulatory Visit: Payer: Self-pay

## 2020-09-21 ENCOUNTER — Ambulatory Visit (INDEPENDENT_AMBULATORY_CARE_PROVIDER_SITE_OTHER): Payer: Medicare Other | Admitting: Dermatology

## 2020-09-21 DIAGNOSIS — Z85820 Personal history of malignant melanoma of skin: Secondary | ICD-10-CM | POA: Diagnosis not present

## 2020-09-21 DIAGNOSIS — Z1283 Encounter for screening for malignant neoplasm of skin: Secondary | ICD-10-CM

## 2020-09-21 DIAGNOSIS — Z85828 Personal history of other malignant neoplasm of skin: Secondary | ICD-10-CM | POA: Diagnosis not present

## 2020-09-21 DIAGNOSIS — Z86018 Personal history of other benign neoplasm: Secondary | ICD-10-CM

## 2020-09-21 DIAGNOSIS — L570 Actinic keratosis: Secondary | ICD-10-CM | POA: Diagnosis not present

## 2020-09-21 DIAGNOSIS — D692 Other nonthrombocytopenic purpura: Secondary | ICD-10-CM

## 2020-09-21 DIAGNOSIS — L814 Other melanin hyperpigmentation: Secondary | ICD-10-CM

## 2020-09-21 DIAGNOSIS — S81812A Laceration without foreign body, left lower leg, initial encounter: Secondary | ICD-10-CM

## 2020-09-21 DIAGNOSIS — L821 Other seborrheic keratosis: Secondary | ICD-10-CM

## 2020-09-21 DIAGNOSIS — L578 Other skin changes due to chronic exposure to nonionizing radiation: Secondary | ICD-10-CM

## 2020-09-21 DIAGNOSIS — D1800 Hemangioma unspecified site: Secondary | ICD-10-CM

## 2020-09-21 DIAGNOSIS — L820 Inflamed seborrheic keratosis: Secondary | ICD-10-CM

## 2020-09-21 DIAGNOSIS — Z872 Personal history of diseases of the skin and subcutaneous tissue: Secondary | ICD-10-CM

## 2020-09-21 DIAGNOSIS — D229 Melanocytic nevi, unspecified: Secondary | ICD-10-CM

## 2020-09-21 MED ORDER — MUPIROCIN 2 % EX OINT: 1.0000 "application " | TOPICAL_OINTMENT | Freq: Every day | CUTANEOUS | 2 refills | Status: DC

## 2020-09-21 NOTE — Patient Instructions (Signed)

## 2020-09-21 NOTE — Progress Notes (Signed)
Follow-Up Visit   Subjective  Martin Lopez is a 75 y.o. male who presents for the following: Total body skin exam (Hx of Metastatic Melanoma, hx of BCCs, Hx of SCCs hx AKs). The patient presents for Total-Body Skin Exam (TBSE) for skin cancer screening and mole check.   The following portions of the chart were reviewed this encounter and updated as appropriate:   Tobacco  Allergies  Meds  Problems  Med Hx  Surg Hx  Fam Hx     Review of Systems:  No other skin or systemic complaints except as noted in HPI or Assessment and Plan.  Objective  Well appearing patient in no apparent distress; mood and affect are within normal limits.  A full examination was performed including scalp, head, eyes, ears, nose, lips, neck, chest, axillae, abdomen, back, buttocks, bilateral upper extremities, bilateral lower extremities, hands, feet, fingers, toes, fingernails, and toenails. All findings within normal limits unless otherwise noted below.  scalp x 1, neck x 16, Total = 17 Erythematous keratotic or waxy stuck-on papule or plaque.   Scalp x 4, R ear x 1 (5) Pink scaly macules  R zygomatic Healing excision site  R distal deltoid Well healed scar with no evidence of recurrence, no lymphadenopathy.   L leg Lacerations left leg  Assessment & Plan  Inflamed seborrheic keratosis scalp x 1, neck x 16, Total = 17  Destruction of lesion - scalp x 1, neck x 16, Total = 17 Complexity: simple   Destruction method: cryotherapy   Informed consent: discussed and consent obtained   Timeout:  patient name, date of birth, surgical site, and procedure verified Lesion destroyed using liquid nitrogen: Yes   Region frozen until ice ball extended beyond lesion: Yes   Outcome: patient tolerated procedure well with no complications   Post-procedure details: wound care instructions given    AK (actinic keratosis) (5) Scalp x 4, R ear x 1  Destruction of lesion - Scalp x 4, R ear x 1 Complexity:  simple   Destruction method: cryotherapy   Informed consent: discussed and consent obtained   Timeout:  patient name, date of birth, surgical site, and procedure verified Lesion destroyed using liquid nitrogen: Yes   Region frozen until ice ball extended beyond lesion: Yes   Outcome: patient tolerated procedure well with no complications   Post-procedure details: wound care instructions given    History of basal cell carcinoma (BCC) R zygomatic 1 day s/p MOHs Healing excision site Wound cleansed with peroxide, mupirocin oint applied and bandage  History of melanoma R distal deltoid 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  No lymphadenopathy.  Observe for recurrence. Call clinic for new or changing lesions.  Recommend regular skin exams, daily broad-spectrum spf 30+ sunscreen use, and photoprotection.    Finished Systemic Immun[facility] Mar 01[facility] 2020 [facility].  Laceration of left lower extremity, initial encounter L leg Start Mupirocin oint qd to aa  mupirocin ointment (BACTROBAN) 2 % - L leg Apply 1 application topically daily. Qd to lacerations on left leg  Skin cancer screening  Lentigines - Scattered tan macules - Due to sun exposure - Benign-appering, observe - Recommend daily broad spectrum sunscreen SPF 30+ to sun-exposed areas, reapply every 2 hours as needed. - Call for any changes  Seborrheic Keratoses - Stuck-on, waxy, tan-brown papules and/or plaques  - Benign-appearing - Discussed benign etiology  and prognosis. - Observe - Call for any changes  Melanocytic Nevi - Tan-brown and/or pink-flesh-colored symmetric macules and papules - Benign appearing on exam today - Observation - Call clinic for new or changing moles - Recommend daily use of broad spectrum spf 30+ sunscreen to sun-exposed areas.   Hemangiomas - Red papules -  Discussed benign nature - Observe - Call for any changes  Actinic Damage - Chronic condition, secondary to cumulative UV/sun exposure - diffuse scaly erythematous macules with underlying dyspigmentation - Recommend daily broad spectrum sunscreen SPF 30+ to sun-exposed areas, reapply every 2 hours as needed.  - Staying in the shade or wearing long sleeves, sun glasses (UVA+UVB protection) and wide brim hats (4-inch brim around the entire circumference of the hat) are also recommended for sun protection.  - Call for new or changing lesions.  Skin cancer screening performed today.   History of Basal Cell Carcinoma of the Skin - No evidence of recurrence today - Recommend regular full body skin exams - Recommend daily broad spectrum sunscreen SPF 30+ to sun-exposed areas, reapply every 2 hours as needed.  - Call if any new or changing lesions are noted between office visits   History of Squamous Cell Carcinoma of the Skin - No evidence of recurrence today - No lymphadenopathy - Recommend regular full body skin exams - Recommend daily broad spectrum sunscreen SPF 30+ to sun-exposed areas, reapply every 2 hours as needed.  - Call if any new or changing lesions are noted between office visits   History of Dysplastic Nevi - No evidence of recurrence today - Recommend regular full body skin exams - Recommend daily broad spectrum sunscreen SPF 30+ to sun-exposed areas, reapply every 2 hours as needed.  - Call if any new or changing lesions are noted between office visits   Purpura - Chronic; persistent and recurrent.  Treatable, but not curable. - Violaceous macules and patches - Benign - Related to trauma, age, sun damage and/or use of blood thinners, chronic use of topical and/or oral steroids - Observe - Can use OTC arnica containing moisturizer such as Dermend Bruise Formula if desired - Call for worsening or other concerns  Return in about 4 months (around 01/21/2021).  I, Martin Lopez, CMA, am acting as scribe for [name], MD .   I, Martin Lopez, RMA, am acting as scribe for [name], MD . Documentation: I have reviewed the above documentation for accuracy and completeness, and I agree with the above.  [name], MD

## 2020-09-22 ENCOUNTER — Encounter: Payer: Self-pay | Admitting: Dermatology

## 2020-11-11 ENCOUNTER — Telehealth: Payer: Self-pay | Admitting: Cardiovascular Disease

## 2020-11-11 NOTE — Telephone Encounter (Signed)
Pt c/o of Chest Pain: STAT if CP now or developed within 24 hours  1. Are you having CP right now? Somewhat subsided   2. Are you experiencing any other symptoms (ex. SOB, nausea, vomiting, sweating)? Uncomfortable to breath when episode woke patient   3. How long have you been experiencing CP? Middle of the night located high towards throat and got a little worse when bending over   4. Is your CP continuous or coming and going? Subsided somewhat now feeling tired   5. Have you taken Nitroglycerin? Tums may have helped a little  ?  Bp was 122/83

## 2020-11-11 NOTE — Telephone Encounter (Signed)
Incoming call from Triage from Mr. Blaustein. Mr Sevigny reports he woke up at 4 am feeling "uncomfortable to breath, not SOB, no CP, just "felt weird to breath" and had a "throat discomfort, like it was in my esophagus". Pt reports it was "short lived", check BP 123/80 HR 80, then went back to bed and slept.  Woke up around 08:30 (normal time), BP 105/62 HR 82, felt fine, no SOB, no CP or any other symptoms. Did his usual treadmill run/walk for about 15 mins. Not winded and no CP associated with exercise. But feels fatigue.   9 am BP 125/80 HR 97 (after treadmill)   Pt feels fine, did have one period where he bent over after waking up and had a "slit second" of chest pressure when bending over, other then that, just feels "a little fatigue". Wanted to let Dr. [name] know so he is aware so he can discuss at up coming appt.  Pt is to travel to [place] [facility] over the weekend, advised to take it east, rest, and if drive down makes him fatigue, stop at rest areas and gas station to walk around.   If he starts to develop CP, chest pressures, pain radiating to arm, neck, or jaw, SOB, dizziness, or other concerning symptoms, change in BP and HR< then seek ED for cardiac work-up to r/o cardiac event. Pt verbalized understanding and very thankful for taking his call and speaking with him, will "take it easy" while out-of-town.  Appt 11/14 at 4:20 pm with Dr. [name] Refused sooner with APP

## 2020-12-04 NOTE — Progress Notes (Signed)
Cardiology Office Note  Date:  12/05/2020   ID:  Malaki, Koury December 08, 1945, MRN [id number]  PCP:  [name], MD   Chief Complaint  Patient presents with   Chest Pain    Patient c/o chest pressure and shortness of breath. Medications reviewed by the patient verbally.     HPI:  Mr. Chappuis is a very pleasant 75 year-old gentleman with a history of hypertension,  hyperlipidemia,  strong family history of coronary artery disease  Prior cardiac calcium score in 2009 of  Zero  s/p hemorrhoidectomy who presents  for routine followup of his hypertension and hyperlipidemia.   LOV 4/22 Prior episodes of chest discomfort  On 11/11/20 woke up at 4 am feeling "uncomfortable to breath, not SOB, no CP, just "felt weird to breath" and had a "throat discomfort, like it was in my esophagus".  went back to bed and slept.   Since then some chest pain at times, some fatigue Tried to push through and exercise despite his symptoms  Prior history of chest discomfort on prior visits  Taking half dose irbesartan once a day, off amlodipine, taking Crestor every other day  Lab work reviewed Total cholesterol 161 LDL 73  EKG personally reviewed by myself on todays  visit Shows normal sinus rhythm 59 bpm no significant ST or T wave changes    Previous CT scan of the chest showing no significant coronary calcifications, no significant plaque in the aorta    PMH:   has a past medical history of Allergy, Basal cell carcinoma (01/05/2013), Basal cell carcinoma (03/18/2013), Basal cell carcinoma (03/22/2016), Basal cell carcinoma (03/22/2016), Basal cell carcinoma (03/28/2017), Basal cell carcinoma (06/03/2017), Basal cell carcinoma (06/11/2018), Basal cell carcinoma (06/11/2018), Basal cell carcinoma (06/11/2018), Basal cell carcinoma (12/10/2018), Basal cell carcinoma (05/25/2020), Bone spur, Bulging disc (2013), Cancer [facility]), Dysplastic nevus (01/05/2013), GERD (gastroesophageal reflux disease),  HLD (hyperlipidemia), HTN (hypertension), Melanoma ([place]) (12/10/2018), Squamous cell carcinoma of skin (06/10/2019), and Squamous cell carcinoma of skin (09/16/2019).  PSH:    Past Surgical History:  Procedure Laterality Date   BASAL CELL CARCINOMA EXCISION  2013   right leg,FOREHEAD   COLONOSCOPY  2016   COLONOSCOPY     GREEN LIGHT LASER TURP (TRANSURETHRAL RESECTION OF PROSTATE N/A 09/21/2014   Procedure: GREEN LIGHT LASER TURP (TRANSURETHRAL RESECTION OF PROSTATE;  Surgeon: [name], MD;  Location: [facility] ORS;  Service: Urology;  Laterality: N/A;   HEMORRHOID SURGERY N/A 02/28/2016   Procedure: HEMORRHOIDECTOMY;  Surgeon: [name], MD;  Location: [facility] ORS;  Service: General;  Laterality: N/A;   TONSILLECTOMY      Current Outpatient Medications  Medication Sig Dispense Refill   esomeprazole (NEXIUM) 40 MG capsule Take 40 mg by mouth daily before breakfast. Takes 0.5 tablet     hydrocortisone (ANUSOL-HC) 2.5 % rectal cream Place 1 application rectally 2 (two) times daily. As needed. 30 g 3   ibuprofen (ADVIL,MOTRIN) 200 MG tablet Take 400 mg by mouth every 6 (six) hours as needed for mild pain or moderate pain.     irbesartan (AVAPRO) 300 MG tablet TAKE 1 TABLET DAILY 90 tablet 2   Multiple Vitamin (MULTIVITAMIN) capsule Take 1 capsule by mouth daily.     mupirocin ointment (BACTROBAN) 2 % Apply 1 application topically daily. Qd to lacerations on left leg 22 g 2   rosuvastatin (CRESTOR) 10 MG tablet Take 10 mg by mouth every other day.      sildenafil (VIAGRA) 100 MG tablet  Take 100 mg by mouth as needed.     terazosin (HYTRIN) 10 MG capsule Take 1 capsule (10 mg total) by mouth at bedtime.     triamcinolone cream (KENALOG) 0.1 % Apply 1 application topically 2 (two) times daily as needed. 454 g 2   valACYclovir (VALTREX) 1000 MG tablet Take 1 tablet (1,000 mg total) by mouth as directed. Take 2 PO at onset and 2 PO 12 hours later 30 tablet 6   zolpidem (AMBIEN) 5 MG  tablet Take 5 mg by mouth at bedtime as needed for sleep. Pt takes 1/2 tablet each night     mupirocin ointment (BACTROBAN) 2 % Apply to skin qd to tid (Patient not taking: Reported on 12/05/2020) 22 g 0   No current facility-administered medications for this visit.     Allergies:   Patient has no known allergies.   Social History:  The patient  reports that he has never smoked. He has never used smokeless tobacco. He reports current alcohol use of about 1.0 standard drink per week. He reports that he does not use drugs.   Family History:   family history includes Cancer in his father; Coronary artery disease in his maternal uncle; Ovarian cancer in his mother.    Review of Systems: Review of Systems  Constitutional: Negative.   HENT: Negative.    Respiratory: Negative.    Cardiovascular:  Positive for chest pain.  Gastrointestinal: Negative.   Musculoskeletal: Negative.   Neurological: Negative.   Psychiatric/Behavioral: Negative.    All other systems reviewed and are negative.  PHYSICAL EXAM: VS:  BP 120/70 (BP Location: Left Arm, Patient Position: Sitting, Cuff Size: Normal)   Pulse (!) 59   Ht 5\' 8"  (1.727 m)   Wt 170 lb (77.1 kg)   SpO2 98%   BMI 25.85 kg/m  , BMI Body mass index is 25.85 kg/m. Constitutional:  oriented to person, place, and time. No distress.  HENT:  Head: Grossly normal Eyes:  no discharge. No scleral icterus.  Neck: No JVD, no carotid bruits  Cardiovascular: Regular rate and rhythm, no murmurs appreciated Pulmonary/Chest: Clear to auscultation bilaterally, no wheezes or rails Abdominal: Soft.  no distension.  no tenderness.  Musculoskeletal: Normal range of motion Neurological:  normal muscle tone. Coordination normal. No atrophy Skin: Skin warm and dry Psychiatric: normal affect, pleasant   Recent Labs: No results found for requested labs within last 8760 hours.    Lipid Panel No results found for: CHOL, HDL, LDLCALC, TRIG    Wt  Readings from Last 3 Encounters:  12/05/20 170 lb (77.1 kg)  03/22/20 168 lb 4 oz (76.3 kg)  02/16/20 167 lb (75.8 kg)      ASSESSMENT AND PLAN:  GERD On Nexium, Has small hiatal hernia Prior EGD  Chest pain/angina Having episodes on the treadmill, also waking up with chest pain Has aortic atherosclerosis Recommended cardiac CTA   HYPERTENSION, BENIGN - Blood pressure is well controlled on today's visit. No changes made to the medications. Off amlodipine  Mixed hyperlipidemia  On Crestor every other day  Melanoma Followed at [facility], underwent chemotherapy lymph node positive Has periodic CT scans  Diarrhea Stable   Total encounter time more than 25 minutes  Greater than 50% was spent in counseling and coordination of care with the patient    Orders Placed This Encounter  Procedures   CT CORONARY MORPH W/CTA COR W/SCORE W/CA W/CM &/OR WO/CM   Basic metabolic panel   EKG 16-[id number]  Signed, Esmond Plants, M.D., Ph.D. 12/05/2020  [place], [place]

## 2020-12-05 ENCOUNTER — Ambulatory Visit (INDEPENDENT_AMBULATORY_CARE_PROVIDER_SITE_OTHER): Payer: Medicare Other | Admitting: Cardiovascular Disease

## 2020-12-05 ENCOUNTER — Encounter: Payer: Self-pay | Admitting: Cardiovascular Disease

## 2020-12-05 ENCOUNTER — Other Ambulatory Visit: Payer: Self-pay

## 2020-12-05 VITALS — BP 120/70 | HR 59 | Ht 68.0 in | Wt 170.0 lb

## 2020-12-05 DIAGNOSIS — I209 Angina pectoris, unspecified: Secondary | ICD-10-CM | POA: Diagnosis not present

## 2020-12-05 DIAGNOSIS — R079 Chest pain, unspecified: Secondary | ICD-10-CM

## 2020-12-05 DIAGNOSIS — E782 Mixed hyperlipidemia: Secondary | ICD-10-CM | POA: Diagnosis not present

## 2020-12-05 DIAGNOSIS — Z01810 Encounter for preprocedural cardiovascular examination: Secondary | ICD-10-CM

## 2020-12-05 DIAGNOSIS — I10 Essential (primary) hypertension: Secondary | ICD-10-CM

## 2020-12-05 DIAGNOSIS — R0602 Shortness of breath: Secondary | ICD-10-CM

## 2020-12-05 NOTE — Patient Instructions (Addendum)
Medication Instructions:  No changes  If you need a refill on your cardiac medications before your next appointment, please call your pharmacy.   Lab work: BMP (before CCTA)  Call for an appt  Testing/Procedures: Cardiac CTA (we will call with results)  Follow-Up: At [facility], you and your health needs are our priority.  As part of our continuing mission to provide you with exceptional heart care, we have created designated Provider Care Teams.  These Care Teams include your primary Cardiologist (physician) and Advanced Practice Providers (APPs -  Physician Assistants and Nurse Practitioners) who all work together to provide you with the care you need, when you need it.  You will need a follow up appointment in 12 months  Providers on your designated Care Team:   Murray Hodgkins, NP Christell Faith, PA-C Cadence Kathlen Mody, [place]   COVID-19 Vaccine Information can be found at: [email or web address] For questions related to vaccine distribution or appointments, please email [email or web address] or call [phone].   Cardiac (coronary) CTA   [facility] [address] [place], [place] [address] [phone]  We will call with date and time Please arrive 15 mins early for check-in and test prep.  Please follow these instructions carefully:  Hold all erectile dysfunction medications at least 3 days (72 hrs) prior to test. (Viagra)  On the Night Before the Test: Be sure to Drink plenty of water. Do not consume any caffeinated/decaffeinated beverages or chocolate 12 hours prior to your test. This includes decaf as well Do not take any antihistamines 12 hours prior to your test. Such as Benadryl  On the Day of the Test: Drink plenty of water until 1 hour prior to the test. Do not eat any food 4 hours prior to the test. You may take your regular medications prior to the test.   Take metoprolol (Lopressor) two hours prior to test. Not needed HOLD Furosemide/Hydrochlorothiazide morning of the test. No fluid pills   After the Test: Drink plenty of water. After receiving IV contrast, you may experience a mild flushed feeling. This is normal. On occasion, you may experience a mild rash up to 24 hours after the test. This is not dangerous. If this occurs, you can take Benadryl 25 mg and increase your fluid intake (to flush out the kidneys) If you experience trouble breathing, this can be serious. If it is severe call 911 IMMEDIATELY. If it is mild, please call our office.  Once we have confirmed authorization from your insurance company, we will call you to set up a date and time for your test. Based on how quickly your insurance processes prior authorizations requests, please allow up to 4 weeks to be contacted for scheduling your Cardiac CT appointment. Be advised that routine Cardiac CT appointments could be scheduled as many as 8 weeks after your provider has ordered it.   For scheduling needs, including cancellations and rescheduling, please call [place], [phone].

## 2020-12-06 ENCOUNTER — Other Ambulatory Visit (INDEPENDENT_AMBULATORY_CARE_PROVIDER_SITE_OTHER): Payer: Medicare Other

## 2020-12-06 DIAGNOSIS — Z01810 Encounter for preprocedural cardiovascular examination: Secondary | ICD-10-CM

## 2020-12-07 LAB — BASIC METABOLIC PANEL
BUN/Creatinine Ratio: 13 (ref 10–24)
BUN: 14 mg/dL (ref 8–27)
CO2: 22 mmol/L (ref 20–29)
Calcium: 8.9 mg/dL (ref 8.6–10.2)
Chloride: 103 mmol/L (ref 96–106)
Creatinine, Ser: 1.07 mg/dL (ref 0.76–1.27)
Glucose: 95 mg/dL (ref 70–99)
Potassium: 4.3 mmol/L (ref 3.5–5.2)
Sodium: 140 mmol/L (ref 134–144)
eGFR: 73 mL/min/{1.73_m2} (ref >59)

## 2020-12-21 ENCOUNTER — Telehealth (HOSPITAL_COMMUNITY): Payer: Medicare Other | Admitting: *Deleted

## 2020-12-21 NOTE — Telephone Encounter (Signed)
Reaching out to patient to offer assistance regarding upcoming cardiac imaging study; pt verbalizes understanding of appt date/time, parking situation and where to check in, pre-test NPO status  and verified current allergies; name and call back number provided for further questions should they arise ? ?Shameca Landen RN Navigator Cardiac Imaging ?[facility] ?[phone] office ?[phone] cell ? ?

## 2020-12-22 ENCOUNTER — Other Ambulatory Visit: Payer: Self-pay

## 2020-12-22 ENCOUNTER — Ambulatory Visit
Admission: RE | Admit: 2020-12-22 | Discharge: 2020-12-22 | Disposition: A | Discharge status: Home / Self Care | Payer: Medicare Other | Source: Ambulatory Visit | Attending: Cardiovascular Disease | Admitting: Cardiovascular Disease

## 2020-12-22 DIAGNOSIS — I10 Essential (primary) hypertension: Secondary | ICD-10-CM | POA: Insufficient documentation

## 2020-12-22 DIAGNOSIS — R079 Chest pain, unspecified: Secondary | ICD-10-CM | POA: Insufficient documentation

## 2020-12-22 DIAGNOSIS — R0602 Shortness of breath: Secondary | ICD-10-CM | POA: Insufficient documentation

## 2020-12-22 DIAGNOSIS — I209 Angina pectoris, unspecified: Secondary | ICD-10-CM | POA: Diagnosis present

## 2020-12-22 MED ORDER — IOHEXOL 350 MG/ML SOLN
75.0000 mL | Freq: Once | INTRAVENOUS | Status: AC | PRN
  Administered 2020-12-22: 75 mL via INTRAVENOUS

## 2020-12-22 MED ORDER — METOPROLOL TARTRATE 5 MG/5ML IV SOLN
10.0000 mg | Freq: Once | INTRAVENOUS | Status: AC
  Administered 2020-12-22: 10 mg via INTRAVENOUS

## 2020-12-22 MED ORDER — NITROGLYCERIN 0.4 MG SL SUBL
0.8000 mg | SUBLINGUAL_TABLET | Freq: Once | SUBLINGUAL | Status: AC
  Administered 2020-12-22: 0.8 mg via SUBLINGUAL

## 2020-12-22 NOTE — Progress Notes (Signed)
Patient tolerated CT well. Drank water after and ate crackers.. Vital signs stable encourage to drink water throughout day.Reasons explained and verbalized understanding. Ambulated steady gait.

## 2020-12-29 ENCOUNTER — Encounter: Payer: Self-pay | Admitting: Cardiovascular Disease

## 2021-01-03 ENCOUNTER — Telehealth: Payer: Self-pay

## 2021-01-03 NOTE — Telephone Encounter (Signed)
Left detail message on VM of pt's recent results okay by DPR, Dr. [name] advised   "Cardiac CTA  Low calcium score 77  Minimal stenosis noted,  Chest pain does not appear to be from blockages  Overall great study "  At this time, no further recommendations or medications changes, advised to call office for any concerns or questions, otherwise will see at next visit.

## 2021-01-25 ENCOUNTER — Other Ambulatory Visit: Payer: Self-pay

## 2021-01-25 ENCOUNTER — Ambulatory Visit (INDEPENDENT_AMBULATORY_CARE_PROVIDER_SITE_OTHER): Payer: Medicare Other | Admitting: Dermatology

## 2021-01-25 ENCOUNTER — Encounter: Payer: Self-pay | Admitting: Dermatology

## 2021-01-25 DIAGNOSIS — L570 Actinic keratosis: Secondary | ICD-10-CM | POA: Diagnosis not present

## 2021-01-25 DIAGNOSIS — L814 Other melanin hyperpigmentation: Secondary | ICD-10-CM

## 2021-01-25 DIAGNOSIS — D229 Melanocytic nevi, unspecified: Secondary | ICD-10-CM

## 2021-01-25 DIAGNOSIS — D492 Neoplasm of unspecified behavior of bone, soft tissue, and skin: Secondary | ICD-10-CM

## 2021-01-25 DIAGNOSIS — Z85828 Personal history of other malignant neoplasm of skin: Secondary | ICD-10-CM

## 2021-01-25 DIAGNOSIS — L821 Other seborrheic keratosis: Secondary | ICD-10-CM

## 2021-01-25 DIAGNOSIS — Z1283 Encounter for screening for malignant neoplasm of skin: Secondary | ICD-10-CM

## 2021-01-25 DIAGNOSIS — Z85820 Personal history of malignant melanoma of skin: Secondary | ICD-10-CM | POA: Diagnosis not present

## 2021-01-25 DIAGNOSIS — L578 Other skin changes due to chronic exposure to nonionizing radiation: Secondary | ICD-10-CM

## 2021-01-25 DIAGNOSIS — L820 Inflamed seborrheic keratosis: Secondary | ICD-10-CM | POA: Diagnosis not present

## 2021-01-25 DIAGNOSIS — D1801 Hemangioma of skin and subcutaneous tissue: Secondary | ICD-10-CM

## 2021-01-25 DIAGNOSIS — Z86018 Personal history of other benign neoplasm: Secondary | ICD-10-CM

## 2021-01-25 NOTE — Progress Notes (Signed)
Follow-Up Visit   Subjective  Martin Lopez is a 76 y.o. male who presents for the following: Total body skin exam (Hx of Melanoma, BCCs, SCCs, Dysplastic nevus). The patient presents for Total-Body Skin Exam (TBSE) for skin cancer screening and mole check.  The patient has spots, moles and lesions to be evaluated, some may be new or changing and the patient has concerns that these could be cancer.   The following portions of the chart were reviewed this encounter and updated as appropriate:   Tobacco   Allergies   Meds   Problems   Med Hx   Surg Hx   Fam Hx      Review of Systems:  No other skin or systemic complaints except as noted in HPI or Assessment and Plan.  Objective  Well appearing patient in no apparent distress; mood and affect are within normal limits.  A full examination was performed including scalp, head, eyes, ears, nose, lips, neck, chest, axillae, abdomen, back, buttocks, bilateral upper extremities, bilateral lower extremities, hands, feet, fingers, toes, fingernails, and toenails. All findings within normal limits unless otherwise noted below.  R distal deltoid Well healed scar with no evidence of recurrence, no lymphadenopathy.   chest x 17 (17) Stuck on waxy paps with erythema   face x 3 (3) Pink scaly macules   L lower eyelid x 1 Stuck on waxy paps with erythema   R ant shoulder Irregular brown macule 0.6cm         Assessment & Plan   Lentigines - Scattered tan macules - Due to sun exposure - Benign-appearing, observe - Recommend daily broad spectrum sunscreen SPF 30+ to sun-exposed areas, reapply every 2 hours as needed. - Call for any changes  Seborrheic Keratoses - Stuck-on, waxy, tan-brown papules and/or plaques  - Benign-appearing - Discussed benign etiology and prognosis. - Observe - Call for any changes  Melanocytic Nevi - Tan-brown and/or pink-flesh-colored symmetric macules and papules - Benign appearing on exam today -  Observation - Call clinic for new or changing moles - Recommend daily use of broad spectrum spf 30+ sunscreen to sun-exposed areas.   Hemangiomas - Red papules - Discussed benign nature - Observe - Call for any changes  Actinic Damage - Chronic condition, secondary to cumulative UV/sun exposure - diffuse scaly erythematous macules with underlying dyspigmentation - Recommend daily broad spectrum sunscreen SPF 30+ to sun-exposed areas, reapply every 2 hours as needed.  - Staying in the shade or wearing long sleeves, sun glasses (UVA+UVB protection) and wide brim hats (4-inch brim around the entire circumference of the hat) are also recommended for sun protection.  - Call for new or changing lesions.  Skin cancer screening performed today.  History of Basal Cell Carcinoma of the Skin - No evidence of recurrence today - Recommend regular full body skin exams - Recommend daily broad spectrum sunscreen SPF 30+ to sun-exposed areas, reapply every 2 hours as needed.  - Call if any new or changing lesions are noted between office visits   History of Squamous Cell Carcinoma of the Skin - No evidence of recurrence today - No lymphadenopathy - Recommend regular full body skin exams - Recommend daily broad spectrum sunscreen SPF 30+ to sun-exposed areas, reapply every 2 hours as needed.  - Call if any new or changing lesions are noted between office visits   History of Dysplastic Nevi - No evidence of recurrence today - Recommend regular full body skin exams - Recommend daily broad  spectrum sunscreen SPF 30+ to sun-exposed areas, reapply every 2 hours as needed.  - Call if any new or changing lesions are noted between office visits   History of melanoma -metastatic.  Patient continues to follow at [facility] oncology. 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 No lymphadenopathy.  Observe for recurrence. Call clinic for new or changing lesions.  Recommend regular skin exams, daily broad-spectrum spf 30+ sunscreen use, and photoprotection.    Inflamed seborrheic keratosis (17) chest x 17 Destruction of lesion - chest x 17 Complexity: simple   Destruction method: cryotherapy   Informed consent: discussed and consent obtained   Timeout:  patient name, date of birth, surgical site, and procedure verified Lesion destroyed using liquid nitrogen: Yes   Region frozen until ice ball extended beyond lesion: Yes   Outcome: patient tolerated procedure well with no complications   Post-procedure details: wound care instructions given    AK (actinic keratosis) (3) face x 3 Destruction of lesion - face x 3 Complexity: simple   Destruction method: cryotherapy   Informed consent: discussed and consent obtained   Timeout:  patient name, date of birth, surgical site, and procedure verified Lesion destroyed using liquid nitrogen: Yes   Region frozen until ice ball extended beyond lesion: Yes   Outcome: patient tolerated procedure well with no complications   Post-procedure details: wound care instructions given    Seborrheic keratosis, inflamed L lower eyelid x 1 Destruction of lesion - L lower eyelid x 1 Complexity: simple   Destruction method: cryotherapy   Informed consent: discussed and consent obtained   Timeout:  patient name, date of birth, surgical site, and procedure verified Lesion destroyed using liquid nitrogen: Yes   Region frozen until ice ball extended beyond lesion: Yes   Outcome: patient tolerated procedure well with no complications   Post-procedure details: wound care instructions given    Neoplasm of skin R ant shoulder  Epidermal / dermal shaving  Lesion diameter (cm):  0.6 Informed consent: discussed and consent obtained   Timeout: patient name,  date of birth, surgical site, and procedure verified   Procedure prep:  Patient was prepped and draped in usual sterile fashion Prep type:  Isopropyl alcohol Anesthesia: the lesion was anesthetized in a standard fashion   Anesthetic:  1% lidocaine w/ epinephrine 1-100,000 buffered w/ 8.4% NaHCO3 Instrument used: flexible razor blade   Hemostasis achieved with: pressure, aluminum chloride and electrodesiccation   Outcome: patient tolerated procedure well   Post-procedure details: sterile dressing applied and wound care instructions given   Dressing type: bandage and bacitracin    Specimen 1 - Surgical pathology Differential Diagnosis: D48.5 Nevus vs Dysplastic nevus r/o CA  Check Margins: No Irregular brown macule 0.6cm  Return in about 6 months (around 07/25/2021) for TBSE, Hx of Melanoma, Hx of BCC, Hx of SCC, Hx of Dysplastic nevi, Hx of AKs.  I, [name], RMA, am acting as scribe for [name], MD . Documentation: I have reviewed the above documentation for accuracy and completeness, and I agree with the above.  [name], MD

## 2021-01-25 NOTE — Patient Instructions (Signed)

## 2021-01-26 ENCOUNTER — Other Ambulatory Visit: Payer: Self-pay | Admitting: Neurosurgery

## 2021-01-30 ENCOUNTER — Telehealth: Payer: Self-pay

## 2021-01-30 NOTE — Telephone Encounter (Signed)
-----   Message from Ralene Bathe, MD sent at 01/30/2021  9:15 AM EST ----- Diagnosis Skin , right ant shoulder PIGMENTED SEBORRHEIC KERATOSIS, INFLAMED  Benign inflamed keratosis No further treatment needed

## 2021-01-30 NOTE — Telephone Encounter (Signed)
Patient informed of pathology results 

## 2021-02-14 NOTE — Progress Notes (Signed)
Surgical Instructions    Your procedure is scheduled on Friday January 27th.  Report to [facility] Main Entrance "A" at 8:45 A.M., then check in with the Admitting office.  Call this number if you have problems the morning of surgery:  [phone]   If you have any questions prior to your surgery date call [phone]: Open Monday-Friday 8am-4pm    Remember:  Do not eat after midnight the night before your surgery  You may drink clear liquids until 7:45am the morning of your surgery.   Clear liquids allowed are: Water, Non-Citrus Juices (without pulp), Carbonated Beverages, Clear Tea, Black Coffee ONLY (NO MILK, CREAM OR POWDERED CREAMER of any kind), and Gatorade    Take these medicines the morning of surgery with A SIP OF WATER esomeprazole (NEXIUM) 40 MG capsule rosuvastatin (CRESTOR) 10 MG tablet  IF NEEDED  valACYclovir (VALTREX) 1000 MG tablet  As of today, STOP taking any Aspirin (unless otherwise instructed by your surgeon) Aleve, Naproxen, Ibuprofen, Motrin, Advil, Goody's, BC's, all herbal medications, fish oil, and all vitamins.  After your COVID test   You are not required to quarantine however you are required to wear a well-fitting mask when you are out and around people not in your household.  If your mask becomes wet or soiled, replace with a new one.  Wash your hands often with soap and water for 20 seconds or clean your hands with an alcohol-based hand sanitizer that contains at least 60% alcohol.  Do not share personal items.  Notify your provider: if you are in close contact with someone who has COVID  or if you develop a fever of 100.4 or greater, sneezing, cough, sore throat, shortness of breath or body aches.           Do not wear jewelry  Do not wear lotions, powders, colognes, or deodorant. Do not shave 48 hours prior to surgery.  Men may shave face and neck. Do not bring valuables to the hospital. DO Not wear nail polish, gel polish, artificial  nails, or any other type of covering on natural nails (fingers and toes) If you have artificial nails or gel coating that need to be removed by a nail salon, please have this removed prior to surgery. Artificial nails or gel coating may interfere with anesthesia's ability to adequately monitor your vital signs.             [facility] is not responsible for any belongings or valuables.  Do NOT Smoke (Tobacco/Vaping)  24 hours prior to your procedure  If you use a CPAP at night, you may bring your mask for your overnight stay.   Contacts, glasses, hearing aids, dentures or partials may not be worn into surgery, please bring cases for these belongings   For patients admitted to the hospital, discharge time will be determined by your treatment team.   Patients discharged the day of surgery will not be allowed to drive home, and someone needs to stay with them for 24 hours.  NO VISITORS WILL BE ALLOWED IN PRE-OP WHERE PATIENTS ARE PREPPED FOR SURGERY.  ONLY 1 SUPPORT PERSON MAY BE PRESENT IN THE WAITING ROOM WHILE YOU ARE IN SURGERY.  IF YOU ARE TO BE ADMITTED, ONCE YOU ARE IN YOUR ROOM YOU WILL BE ALLOWED TWO (2) VISITORS. 1 (ONE) VISITOR MAY STAY OVERNIGHT BUT MUST ARRIVE TO THE ROOM BY 8pm.  Minor children may have two parents present. Special consideration for safety and communication needs will be  reviewed on a case by case basis.  Special instructions:    Oral Hygiene is also important to reduce your risk of infection.  Remember - BRUSH YOUR TEETH THE MORNING OF SURGERY WITH YOUR REGULAR TOOTHPASTE   [facility]- Preparing For Surgery  Before surgery, you can play an important role. Because skin is not sterile, your skin needs to be as free of germs as possible. You can reduce the number of germs on your skin by washing with CHG (chlorahexidine gluconate) Soap before surgery.  CHG is an antiseptic cleaner which kills germs and bonds with the skin to continue killing germs even after  washing.     Please do not use if you have an allergy to CHG or antibacterial soaps. If your skin becomes reddened/irritated stop using the CHG.  Do not shave (including legs and underarms) for at least 48 hours prior to first CHG shower. It is OK to shave your face.  Please follow these instructions carefully.     Shower the NIGHT BEFORE SURGERY and the MORNING OF SURGERY with CHG Soap.   If you chose to wash your hair, wash your hair first as usual with your normal shampoo. After you shampoo, rinse your hair and body thoroughly to remove the shampoo.  Then [employer] and genitals (private parts) with your normal soap and rinse thoroughly to remove soap.  After that Use CHG Soap as you would any other liquid soap. You can apply CHG directly to the skin and wash gently with a scrungie or a clean washcloth.   Apply the CHG Soap to your body ONLY FROM THE NECK DOWN.  Do not use on open wounds or open sores. Avoid contact with your eyes, ears, mouth and genitals (private parts). Wash Face and genitals (private parts)  with your normal soap.   Wash thoroughly, paying special attention to the area where your surgery will be performed.  Thoroughly rinse your body with warm water from the neck down.  DO NOT shower/wash with your normal soap after using and rinsing off the CHG Soap.  Pat yourself dry with a CLEAN TOWEL.  Wear CLEAN PAJAMAS to bed the night before surgery  Place CLEAN SHEETS on your bed the night before your surgery  DO NOT SLEEP WITH PETS.   Day of Surgery:  Take a shower with CHG soap. Wear Clean/Comfortable clothing the morning of surgery Do not apply any deodorants/lotions.   Remember to brush your teeth WITH YOUR REGULAR TOOTHPASTE.   Please read over the following fact sheets that you were given.

## 2021-02-15 ENCOUNTER — Encounter (HOSPITAL_COMMUNITY): Payer: Self-pay

## 2021-02-15 ENCOUNTER — Other Ambulatory Visit: Payer: Self-pay

## 2021-02-15 ENCOUNTER — Encounter (HOSPITAL_COMMUNITY)
Admission: RE | Admit: 2021-02-15 | Discharge: 2021-02-15 | Disposition: A | Discharge status: Home / Self Care | Payer: Medicare Other | Source: Ambulatory Visit | Attending: Neurosurgery | Admitting: Neurosurgery

## 2021-02-15 VITALS — BP 148/75 | HR 86 | Temp 98.0°F | Resp 17 | Ht 68.0 in | Wt 169.2 lb

## 2021-02-15 DIAGNOSIS — K769 Liver disease, unspecified: Secondary | ICD-10-CM | POA: Insufficient documentation

## 2021-02-15 DIAGNOSIS — I10 Essential (primary) hypertension: Secondary | ICD-10-CM | POA: Insufficient documentation

## 2021-02-15 DIAGNOSIS — M5412 Radiculopathy, cervical region: Secondary | ICD-10-CM | POA: Insufficient documentation

## 2021-02-15 DIAGNOSIS — Z85820 Personal history of malignant melanoma of skin: Secondary | ICD-10-CM | POA: Insufficient documentation

## 2021-02-15 DIAGNOSIS — K219 Gastro-esophageal reflux disease without esophagitis: Secondary | ICD-10-CM | POA: Insufficient documentation

## 2021-02-15 DIAGNOSIS — N400 Enlarged prostate without lower urinary tract symptoms: Secondary | ICD-10-CM | POA: Insufficient documentation

## 2021-02-15 DIAGNOSIS — E785 Hyperlipidemia, unspecified: Secondary | ICD-10-CM | POA: Insufficient documentation

## 2021-02-15 DIAGNOSIS — Z01812 Encounter for preprocedural laboratory examination: Secondary | ICD-10-CM | POA: Insufficient documentation

## 2021-02-15 DIAGNOSIS — Z20822 Contact with and (suspected) exposure to covid-19: Secondary | ICD-10-CM | POA: Insufficient documentation

## 2021-02-15 DIAGNOSIS — I2510 Atherosclerotic heart disease of native coronary artery without angina pectoris: Secondary | ICD-10-CM | POA: Insufficient documentation

## 2021-02-15 DIAGNOSIS — M542 Cervicalgia: Secondary | ICD-10-CM

## 2021-02-15 DIAGNOSIS — Z86018 Personal history of other benign neoplasm: Secondary | ICD-10-CM | POA: Insufficient documentation

## 2021-02-15 DIAGNOSIS — Z9079 Acquired absence of other genital organ(s): Secondary | ICD-10-CM | POA: Insufficient documentation

## 2021-02-15 DIAGNOSIS — Z01818 Encounter for other preprocedural examination: Secondary | ICD-10-CM

## 2021-02-15 LAB — TYPE AND SCREEN
ABO/RH(D): A POS
Antibody Screen: NEGATIVE

## 2021-02-15 LAB — CBC
HCT: 38.8 % — ABNORMAL LOW (ref 39.0–52.0)
Hemoglobin: 13.2 g/dL (ref 13.0–17.0)
MCH: 30.6 pg (ref 26.0–34.0)
MCHC: 34 g/dL (ref 30.0–36.0)
MCV: 89.8 fL (ref 80.0–100.0)
Platelets: 170 10*3/uL (ref 150–400)
RBC: 4.32 MIL/uL (ref 4.22–5.81)
RDW: 13 % (ref 11.5–15.5)
WBC: 5 10*3/uL (ref 4.0–10.5)
nRBC: 0 % (ref 0.0–0.2)

## 2021-02-15 LAB — BASIC METABOLIC PANEL
Anion gap: 8 (ref 5–15)
BUN: 12 mg/dL (ref 8–23)
CO2: 26 mmol/L (ref 22–32)
Calcium: 8.9 mg/dL (ref 8.9–10.3)
Chloride: 102 mmol/L (ref 98–111)
Creatinine, Ser: 1.02 mg/dL (ref 0.61–1.24)
GFR, Estimated: >60 mL/min (ref >60)
Glucose, Bld: 97 mg/dL (ref 70–99)
Potassium: 4.7 mmol/L (ref 3.5–5.1)
Sodium: 136 mmol/L (ref 135–145)

## 2021-02-15 LAB — SURGICAL PCR SCREEN
MRSA, PCR: NEGATIVE
Staphylococcus aureus: NEGATIVE

## 2021-02-15 NOTE — Progress Notes (Addendum)
PCP - Dr. Benita Stabile Cardiologist - Dr. [name] [facility]  Chest x-ray - Not indicated EKG - 12/05/20 Stress Test - Yes follows with cards ECHO - 03/30/10 Cardiac Cath - Denies   Sleep Study - Denies  DM - Denies  Aspirin Instructions: Stopped 6 months ago  ERAS Protcol - Yes   COVID TEST- 02/15/21   Anesthesia review: Yes cardiac history. Patient works out daily on his treadmill for 4,408 consecutive days :)  Patient denies shortness of breath, fever, cough and chest pain at PAT appointment   All instructions explained to the patient, with a verbal understanding of the material. Patient agrees to go over the instructions while at home for a better understanding. Patient also instructed to wear a mask while in public after being tested for COVID-19. The opportunity to ask questions was provided.

## 2021-02-16 LAB — SARS CORONAVIRUS 2 (TAT 6-24 HRS): SARS Coronavirus 2: NEGATIVE

## 2021-02-16 NOTE — Progress Notes (Signed)
Anesthesia Chart Review:  Case: [id number] Date/Time: 02/17/21 1030   Procedure: C3-4 C4-5 C5-6 ACDF - 3C/RM 19   Anesthesia type: General   Pre-op diagnosis: Radiculopathy, Cervical region   Location: [facility] OR ROOM 38 / [place] OR   Surgeons: [name], MD       DISCUSSION: Patient is a 76 year old male scheduled for the above procedure.  History includes never smoker, HTN, HLD, GERD, skin cancer (BCC, SCC, melanoma), melanoma (stage IIIB, s/p wide excision RUE melanoma, + 1/5 LN 01/26/19, completed adjuvant nivolumab 03/03/20), BPH (s/p Green light laser TURP 09/21/14).   Last cardiology evaluation by Dr. [name] was on 12/05/20. He reported an episode of "uncomfortable to breath" at 4AM with throat discomfort, no chest pain then but some chest and (prior history with coronary calcium score of 0 in 2009) and fatigue. Decision made to proceed with coronary CTA which was done on 12/22/20 and showed Coronary calcium score of 77.2 (31st percentile) with minimal non-obstructive CAD 90-24%), consider non-atherosclerotic causes of chest pain. He continue to exercise daily on a treadmill.   02/15/2021 presurgical COVID-19 test negative.  Anesthesia team to evaluate on the day of surgery.   VS: BP (!) 148/75    Pulse 86    Temp 36.7 C (Oral)    Resp 17    Ht 5\' 8"  (1.727 m)    Wt 76.7 kg    SpO2 99%    BMI 25.73 kg/m    PROVIDERS: [name], MD is PCP  - [name], MD is cardiologist - [name], [name], MD is surgical oncologist [name]). Last visit 11/08/20 for stage IIIB melanoma follow-up. CT chest/abd/pelvis stable. She notes unchanged subcentimeter pulmonary nodules and liver lesions that are also being monitored on scans. He also had "low grade dysplasia" of a gastric nodule on 03/23/20 EGD/biopsy with plan for repeat EGD ~ 03/2021.  [name], MD is primary dermatologist. Referred to Onalee Hua, MD ([name]) 09/20/20 for for Moh's procedure right zygoma.     LABS: Labs reviewed:  Acceptable for surgery. K 4.7 (some hemolysis present).  (all labs ordered are listed, but only abnormal results are displayed)  Labs Reviewed  CBC - Abnormal; Notable for the following components:      Result Value   HCT 38.8 (*)    All other components within normal limits  SARS CORONAVIRUS 2 (TAT 6-24 HRS)  SURGICAL PCR SCREEN  BASIC METABOLIC PANEL  TYPE AND SCREEN    IMAGES: CT Chest 11/08/20 [facility]): Impression: Stable sclerotic foci right scapula and left humeral head with no new findings.  CT Abd/pelvis 11/08/20 [facility]): Impression: --No new or enlarging metastatic disease in the abdomen or pelvis.  --Unchanged scattered low-density liver lesions.  --Similar nonspecific thickening versus underdistention of the gastric wall, with small hiatal hernia.     EKG: 12/05/20: SB at 59 bpm   CV: CT Coronary 12/22/20: IMPRESSION: 1. Coronary calcium score of 77.2. This was 31st percentile for age and sex matched control. 2. Normal coronary origin with right dominance. 3. Calcified plaque causing minimal stenosis in the LAD and RCA. 4. CAD-RADS 1. Minimal non-obstructive CAD (0-24%). Consider non-atherosclerotic causes of chest pain. 5. Consider preventive therapy and risk factor modification.   Echo 03/30/10: Study Conclusions  - Left ventricle: The cavity size was normal. There was mild    concentric hypertrophy. Systolic function was normal. The    estimated ejection fraction was in the range of 50% to 55%. Wall  motion was normal; there were no regional wall motion    abnormalities. Doppler parameters are consistent with abnormal    left ventricular relaxation (grade 1 diastolic dysfunction).  - Aortic valve: Mild regurgitation.  - Pulmonary arteries: Systolic pressure was within the normal range.  Impressions:  - Essentially a normal study, diastolic dysfunction noted.    Past Medical History:  Diagnosis Date   Allergy    Arthritis 2022   Basal cell  carcinoma 01/05/2013   Left proximal pretibial.   Basal cell carcinoma 03/18/2013   Left mid back. Excised.   Basal cell carcinoma 03/22/2016   Left mid back. Pigmented.   Basal cell carcinoma 03/22/2016   LUQ abdomen. Superficial.   Basal cell carcinoma 03/28/2017   Left lateral knee. Superficial and nodular.    Basal cell carcinoma 06/03/2017   Left medial canthus. Nodular. Excised: 07/16/2017, margin focally involved.   Basal cell carcinoma 06/11/2018   Right post. lateral top of shoulder. Superficial and nodular patterns.   Basal cell carcinoma 06/11/2018   Left prox. med. pretibial sup. Nodular pattern.   Basal cell carcinoma 06/11/2018   Left prox. med. pretibial. inf. BCC with sclerosis   Basal cell carcinoma 12/10/2018   Left post. waistline 3cm lat to spine above sacrum. Superficial and nodular patterns.   Basal cell carcinoma 05/25/2020   right zygomatic area, [facility] 09/20/20   Bone spur    kneck   Bulging disc 2013   Cancer ([place])    basal cell ca - bil, bil legs, scalp, face   Dysplastic nevus 01/05/2013   Right superior sacral area. Moderate atypia, limited margins free.   GERD (gastroesophageal reflux disease)    HLD (hyperlipidemia)    HTN (hypertension)    Melanoma ([place]) 12/10/2018   Right distal deltoid. MM. 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   Squamous cell carcinoma of skin 06/10/2019   Left clavicle. WD SCC. EDC.   Squamous cell carcinoma of skin 09/16/2019   R prox forearm     Past Surgical History:  Procedure Laterality Date   BASAL CELL CARCINOMA EXCISION  2013   right leg,FOREHEAD   COLONOSCOPY  2016   COLONOSCOPY     GREEN LIGHT LASER TURP (TRANSURETHRAL RESECTION OF PROSTATE N/A 09/21/2014   Procedure: GREEN LIGHT LASER TURP (TRANSURETHRAL RESECTION OF PROSTATE;  Surgeon: [name], MD;  Location: [facility] ORS;  Service: Urology;  Laterality: N/A;   HEMORRHOID SURGERY N/A 02/28/2016   Procedure:  HEMORRHOIDECTOMY;  Surgeon: [name], MD;  Location: [facility] ORS;  Service: General;  Laterality: N/A;   TONSILLECTOMY      MEDICATIONS:  esomeprazole (NEXIUM) 40 MG capsule   hydrocortisone (ANUSOL-HC) 2.5 % rectal cream   ibuprofen (ADVIL,MOTRIN) 200 MG tablet   irbesartan (AVAPRO) 300 MG tablet   Multiple Vitamins-Minerals (MULTIVITAMIN WITH MINERALS) tablet   rosuvastatin (CRESTOR) 10 MG tablet   sildenafil (VIAGRA) 100 MG tablet   terazosin (HYTRIN) 10 MG capsule   triamcinolone cream (KENALOG) 0.1 %   valACYclovir (VALTREX) 1000 MG tablet   zolpidem (AMBIEN) 5 MG tablet   No current facility-administered medications for this encounter.    Myra Gianotti, PA-C Surgical Short Stay/Anesthesiology [facility] Phone [phone] [facility] Phone [phone] 02/16/2021 9:56 AM

## 2021-02-16 NOTE — Anesthesia Preprocedure Evaluation (Addendum)
Anesthesia Evaluation  Patient identified by MRN, date of birth, ID band Patient awake    Reviewed: Allergy & Precautions, H&P , NPO status , Patient's Chart, lab work & pertinent test results  Airway Mallampati: II  TM Distance: >3 FB Neck ROM: Full    Dental no notable dental hx. (+) Teeth Intact, Dental Advisory Given   Pulmonary neg pulmonary ROS,    Pulmonary exam normal breath sounds clear to auscultation       Cardiovascular hypertension, Pt. on medications  Rhythm:Regular Rate:Normal  CT Coronary 12/22/20: IMPRESSION: 1. Coronary calcium score of 77.2. This was 31st percentile for age and sex matched control. 2. Normal coronary origin with right dominance. 3. Calcified plaque causing minimal stenosis in the LAD and RCA. 4. CAD-RADS 1. Minimal non-obstructive CAD (0-24%). Consider non-atherosclerotic causes of chest pain. 5. Consider preventive therapy and risk factor modification.    Neuro/Psych negative neurological ROS  negative psych ROS   GI/Hepatic Neg liver ROS, GERD  Medicated,  Endo/Other  negative endocrine ROS  Renal/GU negative Renal ROS  negative genitourinary   Musculoskeletal  (+) Arthritis , Osteoarthritis,    Abdominal   Peds  Hematology negative hematology ROS (+)   Anesthesia Other Findings   Reproductive/Obstetrics negative OB ROS                           Anesthesia Physical Anesthesia Plan  ASA: 2  Anesthesia Plan: General   Post-op Pain Management: Tylenol PO (pre-op)   Induction: Intravenous  PONV Risk Score and Plan: 3 and Ondansetron, Dexamethasone and Treatment may vary due to age or medical condition  Airway Management Planned: Oral ETT  Additional Equipment:   Intra-op Plan:   Post-operative Plan: Extubation in OR  Informed Consent: I have reviewed the patients History and Physical, chart, labs and discussed the procedure including the  risks, benefits and alternatives for the proposed anesthesia with the patient or authorized representative who has indicated his/her understanding and acceptance.     Dental advisory given  Plan Discussed with: CRNA  Anesthesia Plan Comments: (PAT note written 02/16/2021 by Myra Gianotti, PA-C. )       Anesthesia Quick Evaluation

## 2021-02-17 ENCOUNTER — Encounter (HOSPITAL_COMMUNITY)
Admission: RE | Disposition: A | Discharge status: Home / Self Care | Payer: Self-pay | Source: Home / Self Care | Attending: Neurosurgery

## 2021-02-17 ENCOUNTER — Inpatient Hospital Stay (HOSPITAL_COMMUNITY): Payer: Medicare Other

## 2021-02-17 ENCOUNTER — Inpatient Hospital Stay (HOSPITAL_COMMUNITY): Payer: Medicare Other | Admitting: Anesthesiology

## 2021-02-17 ENCOUNTER — Inpatient Hospital Stay (HOSPITAL_COMMUNITY)
Admission: RE | Admit: 2021-02-17 | Discharge: 2021-02-18 | DRG: 472 | Disposition: A | Discharge status: Home / Self Care | Payer: Medicare Other | Attending: Neurosurgery | Admitting: Neurosurgery

## 2021-02-17 ENCOUNTER — Other Ambulatory Visit: Payer: Self-pay

## 2021-02-17 ENCOUNTER — Inpatient Hospital Stay (HOSPITAL_COMMUNITY): Payer: Medicare Other | Admitting: Vascular Surgery

## 2021-02-17 ENCOUNTER — Encounter (HOSPITAL_COMMUNITY): Payer: Self-pay | Admitting: Neurosurgery

## 2021-02-17 DIAGNOSIS — Z8249 Family history of ischemic heart disease and other diseases of the circulatory system: Secondary | ICD-10-CM

## 2021-02-17 DIAGNOSIS — M542 Cervicalgia: Secondary | ICD-10-CM | POA: Diagnosis present

## 2021-02-17 DIAGNOSIS — Z85828 Personal history of other malignant neoplasm of skin: Secondary | ICD-10-CM | POA: Diagnosis not present

## 2021-02-17 DIAGNOSIS — M5010 Cervical disc disorder with radiculopathy, unspecified cervical region: Secondary | ICD-10-CM | POA: Diagnosis present

## 2021-02-17 DIAGNOSIS — I10 Essential (primary) hypertension: Secondary | ICD-10-CM | POA: Diagnosis present

## 2021-02-17 DIAGNOSIS — Z9079 Acquired absence of other genital organ(s): Secondary | ICD-10-CM

## 2021-02-17 DIAGNOSIS — M4712 Other spondylosis with myelopathy, cervical region: Secondary | ICD-10-CM | POA: Diagnosis present

## 2021-02-17 DIAGNOSIS — M1991 Primary osteoarthritis, unspecified site: Secondary | ICD-10-CM | POA: Diagnosis present

## 2021-02-17 DIAGNOSIS — E785 Hyperlipidemia, unspecified: Secondary | ICD-10-CM | POA: Diagnosis present

## 2021-02-17 DIAGNOSIS — Z85820 Personal history of malignant melanoma of skin: Secondary | ICD-10-CM | POA: Diagnosis not present

## 2021-02-17 DIAGNOSIS — K219 Gastro-esophageal reflux disease without esophagitis: Secondary | ICD-10-CM | POA: Diagnosis present

## 2021-02-17 DIAGNOSIS — Z79899 Other long term (current) drug therapy: Secondary | ICD-10-CM | POA: Diagnosis not present

## 2021-02-17 DIAGNOSIS — Z20822 Contact with and (suspected) exposure to covid-19: Secondary | ICD-10-CM | POA: Diagnosis present

## 2021-02-17 DIAGNOSIS — M4312 Spondylolisthesis, cervical region: Secondary | ICD-10-CM | POA: Diagnosis present

## 2021-02-17 DIAGNOSIS — M4802 Spinal stenosis, cervical region: Secondary | ICD-10-CM | POA: Diagnosis present

## 2021-02-17 DIAGNOSIS — M4722 Other spondylosis with radiculopathy, cervical region: Secondary | ICD-10-CM | POA: Diagnosis present

## 2021-02-17 DIAGNOSIS — Z419 Encounter for procedure for purposes other than remedying health state, unspecified: Secondary | ICD-10-CM

## 2021-02-17 HISTORY — PX: ANTERIOR CERVICAL DECOMP/DISCECTOMY FUSION: SHX1161

## 2021-02-17 LAB — ABO/RH: ABO/RH(D): A POS

## 2021-02-17 SURGERY — ANTERIOR CERVICAL DECOMPRESSION/DISCECTOMY FUSION 3 LEVELS
Anesthesia: General | Site: Spine Cervical

## 2021-02-17 MED ORDER — HYDROMORPHONE HCL 1 MG/ML IJ SOLN
0.2500 mg | INTRAMUSCULAR | Status: DC | PRN
  Administered 2021-02-17 (×4): 0.5 mg via INTRAVENOUS

## 2021-02-17 MED ORDER — FENTANYL CITRATE (PF) 250 MCG/5ML IJ SOLN
INTRAMUSCULAR | Status: DC | PRN
  Administered 2021-02-17: 50 ug via INTRAVENOUS
  Administered 2021-02-17: 100 ug via INTRAVENOUS

## 2021-02-17 MED ORDER — HYDROCODONE-ACETAMINOPHEN 10-325 MG PO TABS: 2.0000 | ORAL_TABLET | ORAL | Status: DC | PRN

## 2021-02-17 MED ORDER — ADULT MULTIVITAMIN W/MINERALS CH
1.0000 | ORAL_TABLET | Freq: Every day | ORAL | Status: DC
  Administered 2021-02-17: 1 via ORAL
  Filled 2021-02-17: qty 1

## 2021-02-17 MED ORDER — ACETAMINOPHEN 650 MG RE SUPP: 650.0000 mg | RECTAL | Status: DC | PRN

## 2021-02-17 MED ORDER — CEFAZOLIN SODIUM-DEXTROSE 2-4 GM/100ML-% IV SOLN
2.0000 g | INTRAVENOUS | Status: AC
  Administered 2021-02-17: 2 g via INTRAVENOUS
  Filled 2021-02-17: qty 100

## 2021-02-17 MED ORDER — THROMBIN 20000 UNITS EX SOLR
CUTANEOUS | Status: AC
  Filled 2021-02-17: qty 20000

## 2021-02-17 MED ORDER — LACTATED RINGERS IV SOLN: INTRAVENOUS | Status: DC

## 2021-02-17 MED ORDER — SODIUM CHLORIDE 0.9% FLUSH: 3.0000 mL | Freq: Two times a day (BID) | INTRAVENOUS | Status: DC

## 2021-02-17 MED ORDER — SUGAMMADEX SODIUM 200 MG/2ML IV SOLN
INTRAVENOUS | Status: DC | PRN
  Administered 2021-02-17: 200 mg via INTRAVENOUS

## 2021-02-17 MED ORDER — THROMBIN 20000 UNITS EX SOLR
CUTANEOUS | Status: DC | PRN
  Administered 2021-02-17: 20 mL via TOPICAL

## 2021-02-17 MED ORDER — HYDROMORPHONE HCL 1 MG/ML IJ SOLN: 1.0000 mg | INTRAMUSCULAR | Status: DC | PRN

## 2021-02-17 MED ORDER — ACETAMINOPHEN 325 MG PO TABS: 650.0000 mg | ORAL_TABLET | ORAL | Status: DC | PRN

## 2021-02-17 MED ORDER — DEXAMETHASONE SODIUM PHOSPHATE 10 MG/ML IJ SOLN
INTRAMUSCULAR | Status: DC | PRN
Start: 2021-02-17 — End: 2021-02-17
  Administered 2021-02-17: 10 mg via INTRAVENOUS

## 2021-02-17 MED ORDER — SODIUM CHLORIDE 0.9% FLUSH: 3.0000 mL | INTRAVENOUS | Status: DC | PRN

## 2021-02-17 MED ORDER — DEXAMETHASONE SODIUM PHOSPHATE 10 MG/ML IJ SOLN
INTRAMUSCULAR | Status: AC
  Filled 2021-02-17: qty 1

## 2021-02-17 MED ORDER — HYDROMORPHONE HCL 1 MG/ML IJ SOLN
INTRAMUSCULAR | Status: AC
  Filled 2021-02-17: qty 1

## 2021-02-17 MED ORDER — THROMBIN 5000 UNITS EX SOLR
OROMUCOSAL | Status: DC | PRN
  Administered 2021-02-17: 5 mL via TOPICAL

## 2021-02-17 MED ORDER — ACETAMINOPHEN 500 MG PO TABS: 1000.0000 mg | ORAL_TABLET | Freq: Once | ORAL | Status: DC

## 2021-02-17 MED ORDER — SODIUM CHLORIDE 0.9 % IV SOLN: 250.0000 mL | INTRAVENOUS | Status: DC

## 2021-02-17 MED ORDER — ALUM & MAG HYDROXIDE-SIMETH 200-200-20 MG/5ML PO SUSP
30.0000 mL | Freq: Four times a day (QID) | ORAL | Status: DC | PRN
  Administered 2021-02-17: 30 mL via ORAL
  Filled 2021-02-17: qty 30

## 2021-02-17 MED ORDER — FENTANYL CITRATE (PF) 250 MCG/5ML IJ SOLN
INTRAMUSCULAR | Status: AC
  Filled 2021-02-17: qty 5

## 2021-02-17 MED ORDER — PHENYLEPHRINE HCL-NACL 20-0.9 MG/250ML-% IV SOLN
INTRAVENOUS | Status: DC | PRN
  Administered 2021-02-17: 15 ug/min via INTRAVENOUS

## 2021-02-17 MED ORDER — LIDOCAINE 2% (20 MG/ML) 5 ML SYRINGE
INTRAMUSCULAR | Status: AC
  Filled 2021-02-17: qty 5

## 2021-02-17 MED ORDER — PROPOFOL 10 MG/ML IV BOLUS
INTRAVENOUS | Status: DC | PRN
Start: 2021-02-17 — End: 2021-02-17
  Administered 2021-02-17: 120 mg via INTRAVENOUS

## 2021-02-17 MED ORDER — SUCCINYLCHOLINE CHLORIDE 200 MG/10ML IV SOSY
PREFILLED_SYRINGE | INTRAVENOUS | Status: AC
  Filled 2021-02-17: qty 10

## 2021-02-17 MED ORDER — 0.9 % SODIUM CHLORIDE (POUR BTL) OPTIME
TOPICAL | Status: DC | PRN
  Administered 2021-02-17: 1000 mL

## 2021-02-17 MED ORDER — ROSUVASTATIN CALCIUM 5 MG PO TABS: 10.0000 mg | ORAL_TABLET | ORAL | Status: DC

## 2021-02-17 MED ORDER — IRBESARTAN 150 MG PO TABS
150.0000 mg | ORAL_TABLET | Freq: Every day | ORAL | Status: DC
  Administered 2021-02-17: 150 mg via ORAL
  Filled 2021-02-17: qty 1

## 2021-02-17 MED ORDER — THROMBIN 5000 UNITS EX SOLR
CUTANEOUS | Status: AC
  Filled 2021-02-17: qty 5000

## 2021-02-17 MED ORDER — ORAL CARE MOUTH RINSE: 15.0000 mL | Freq: Once | OROMUCOSAL | Status: AC

## 2021-02-17 MED ORDER — PHENOL 1.4 % MT LIQD: 1.0000 | OROMUCOSAL | Status: DC | PRN

## 2021-02-17 MED ORDER — PROPOFOL 10 MG/ML IV BOLUS
INTRAVENOUS | Status: AC
  Filled 2021-02-17: qty 20

## 2021-02-17 MED ORDER — TERAZOSIN HCL 5 MG PO CAPS
10.0000 mg | ORAL_CAPSULE | Freq: Every day | ORAL | Status: DC
  Administered 2021-02-17: 10 mg via ORAL
  Filled 2021-02-17: qty 2

## 2021-02-17 MED ORDER — ROCURONIUM BROMIDE 10 MG/ML (PF) SYRINGE
PREFILLED_SYRINGE | INTRAVENOUS | Status: DC | PRN
Start: 2021-02-17 — End: 2021-02-17
  Administered 2021-02-17: 60 mg via INTRAVENOUS
  Administered 2021-02-17 (×3): 20 mg via INTRAVENOUS

## 2021-02-17 MED ORDER — CHLORHEXIDINE GLUCONATE CLOTH 2 % EX PADS: 6.0000 | MEDICATED_PAD | Freq: Once | CUTANEOUS | Status: DC

## 2021-02-17 MED ORDER — ONDANSETRON HCL 4 MG PO TABS: 4.0000 mg | ORAL_TABLET | Freq: Four times a day (QID) | ORAL | Status: DC | PRN

## 2021-02-17 MED ORDER — ROCURONIUM BROMIDE 10 MG/ML (PF) SYRINGE
PREFILLED_SYRINGE | INTRAVENOUS | Status: AC
  Filled 2021-02-17: qty 10

## 2021-02-17 MED ORDER — CEFAZOLIN SODIUM-DEXTROSE 1-4 GM/50ML-% IV SOLN
1.0000 g | Freq: Three times a day (TID) | INTRAVENOUS | Status: AC
  Administered 2021-02-17 – 2021-02-18 (×2): 1 g via INTRAVENOUS
  Filled 2021-02-17 (×2): qty 50

## 2021-02-17 MED ORDER — LIDOCAINE 2% (20 MG/ML) 5 ML SYRINGE
INTRAMUSCULAR | Status: DC | PRN
Start: 2021-02-17 — End: 2021-02-17
  Administered 2021-02-17: 40 mg via INTRAVENOUS

## 2021-02-17 MED ORDER — ZOLPIDEM TARTRATE 5 MG PO TABS
5.0000 mg | ORAL_TABLET | Freq: Every day | ORAL | Status: DC
  Administered 2021-02-17: 5 mg via ORAL
  Filled 2021-02-17: qty 1

## 2021-02-17 MED ORDER — MENTHOL 3 MG MT LOZG: 1.0000 | LOZENGE | OROMUCOSAL | Status: DC | PRN

## 2021-02-17 MED ORDER — PANTOPRAZOLE SODIUM 40 MG PO TBEC
40.0000 mg | DELAYED_RELEASE_TABLET | Freq: Every day | ORAL | Status: DC
  Administered 2021-02-17: 40 mg via ORAL
  Filled 2021-02-17: qty 1

## 2021-02-17 MED ORDER — CYCLOBENZAPRINE HCL 10 MG PO TABS
10.0000 mg | ORAL_TABLET | Freq: Three times a day (TID) | ORAL | Status: DC | PRN
  Administered 2021-02-17: 10 mg via ORAL
  Filled 2021-02-17: qty 1

## 2021-02-17 MED ORDER — ONDANSETRON HCL 4 MG/2ML IJ SOLN: 4.0000 mg | Freq: Four times a day (QID) | INTRAMUSCULAR | Status: DC | PRN

## 2021-02-17 MED ORDER — ONDANSETRON HCL 4 MG/2ML IJ SOLN
INTRAMUSCULAR | Status: AC
  Filled 2021-02-17: qty 2

## 2021-02-17 MED ORDER — CHLORHEXIDINE GLUCONATE 0.12 % MT SOLN
15.0000 mL | Freq: Once | OROMUCOSAL | Status: AC
  Administered 2021-02-17: 15 mL via OROMUCOSAL
  Filled 2021-02-17: qty 15

## 2021-02-17 MED ORDER — ONDANSETRON HCL 4 MG/2ML IJ SOLN
INTRAMUSCULAR | Status: DC | PRN
Start: 2021-02-17 — End: 2021-02-17
  Administered 2021-02-17: 4 mg via INTRAVENOUS

## 2021-02-17 MED ORDER — HYDROCODONE-ACETAMINOPHEN 5-325 MG PO TABS
1.0000 | ORAL_TABLET | ORAL | Status: DC | PRN
  Administered 2021-02-17 – 2021-02-18 (×2): 1 via ORAL
  Filled 2021-02-17 (×2): qty 1

## 2021-02-17 SURGICAL SUPPLY — 56 items
APL SKNCLS STERI-STRIP NONHPOA (GAUZE/BANDAGES/DRESSINGS) ×1
BAG COUNTER SPONGE SURGICOUNT (BAG) ×3 IMPLANT
BAG DECANTER FOR FLEXI CONT (MISCELLANEOUS) ×3 IMPLANT
BAG SPNG CNTER NS LX DISP (BAG) ×1
BAND INSRT 18 STRL LF DISP RB (MISCELLANEOUS) ×2
BAND RUBBER #18 3X1/16 STRL (MISCELLANEOUS) ×6 IMPLANT
BENZOIN TINCTURE PRP APPL 2/3 (GAUZE/BANDAGES/DRESSINGS) ×3 IMPLANT
BUR MATCHSTICK NEURO 3.0 LAGG (BURR) ×3 IMPLANT
CAGE PEEK 7X14X11 (Cage) ×4 IMPLANT
CANISTER SUCT 3000ML PPV (MISCELLANEOUS) ×3 IMPLANT
CARTRIDGE OIL MAESTRO DRILL (MISCELLANEOUS) ×2 IMPLANT
CLSR STERI-STRIP ANTIMIC 1/2X4 (GAUZE/BANDAGES/DRESSINGS) ×1 IMPLANT
DIFFUSER DRILL AIR PNEUMATIC (MISCELLANEOUS) ×3 IMPLANT
DRAPE C-ARM 42X72 X-RAY (DRAPES) ×6 IMPLANT
DRAPE LAPAROTOMY 100X72 PEDS (DRAPES) ×3 IMPLANT
DRAPE MICROSCOPE LEICA (MISCELLANEOUS) ×3 IMPLANT
DURAPREP 6ML APPLICATOR 50/CS (WOUND CARE) ×3 IMPLANT
ELECT COATED BLADE 2.86 ST (ELECTRODE) ×3 IMPLANT
ELECT REM PT RETURN 9FT ADLT (ELECTROSURGICAL) ×2
ELECTRODE REM PT RTRN 9FT ADLT (ELECTROSURGICAL) ×2 IMPLANT
GAUZE 4X4 16PLY ~~LOC~~+RFID DBL (SPONGE) ×1 IMPLANT
GAUZE SPONGE 4X4 12PLY STRL (GAUZE/BANDAGES/DRESSINGS) ×3 IMPLANT
GLOVE EXAM NITRILE XL STR (GLOVE) IMPLANT
GLOVE SURG LTX SZ9 (GLOVE) ×3 IMPLANT
GOWN STRL REUS W/ TWL LRG LVL3 (GOWN DISPOSABLE) IMPLANT
GOWN STRL REUS W/ TWL XL LVL3 (GOWN DISPOSABLE) IMPLANT
GOWN STRL REUS W/TWL 2XL LVL3 (GOWN DISPOSABLE) IMPLANT
GOWN STRL REUS W/TWL LRG LVL3 (GOWN DISPOSABLE) ×6
GOWN STRL REUS W/TWL XL LVL3 (GOWN DISPOSABLE) ×4
HALTER HD/CHIN CERV TRACTION D (MISCELLANEOUS) ×3 IMPLANT
HEMOSTAT POWDER KIT SURGIFOAM (HEMOSTASIS) ×3 IMPLANT
KIT BASIN OR (CUSTOM PROCEDURE TRAY) ×3 IMPLANT
KIT TURNOVER KIT B (KITS) ×3 IMPLANT
NDL SPNL 20GX3.5 QUINCKE YW (NEEDLE) ×2 IMPLANT
NEEDLE SPNL 20GX3.5 QUINCKE YW (NEEDLE) ×2 IMPLANT
NS IRRIG 1000ML POUR BTL (IV SOLUTION) ×3 IMPLANT
OIL CARTRIDGE MAESTRO DRILL (MISCELLANEOUS) ×2
PACK LAMINECTOMY NEURO (CUSTOM PROCEDURE TRAY) ×3 IMPLANT
PAD ARMBOARD 7.5X6 YLW CONV (MISCELLANEOUS) ×9 IMPLANT
PEEK CAGE 7X14X11 (Cage) ×1 IMPLANT
PLATE 3 60XNS SPNE CVD ANT T (Plate) IMPLANT
PLATE 3 ATLANTIS TRANS (Plate) ×2 IMPLANT
SCREW ST FIX 4 ATL 3120213 (Screw) ×8 IMPLANT
SPACER SPNL 11X14X7XPEEK CVD (Cage) IMPLANT
SPCR SPNL 11X14X7XPEEK CVD (Cage) ×2 IMPLANT
SPONGE INTESTINAL PEANUT (DISPOSABLE) ×3 IMPLANT
SPONGE SURGIFOAM ABS GEL 100 (HEMOSTASIS) ×3 IMPLANT
STRIP CLOSURE SKIN 1/2X4 (GAUZE/BANDAGES/DRESSINGS) ×3 IMPLANT
SUT VIC AB 3-0 SH 8-18 (SUTURE) ×3 IMPLANT
SUT VIC AB 4-0 RB1 18 (SUTURE) ×3 IMPLANT
TAPE CLOTH 4X10 WHT NS (GAUZE/BANDAGES/DRESSINGS) ×3 IMPLANT
TAPE CLOTH SURG 4X10 WHT LF (GAUZE/BANDAGES/DRESSINGS) ×1 IMPLANT
TOWEL GREEN STERILE (TOWEL DISPOSABLE) ×3 IMPLANT
TOWEL GREEN STERILE FF (TOWEL DISPOSABLE) ×3 IMPLANT
TRAP SPECIMEN MUCUS 40CC (MISCELLANEOUS) ×3 IMPLANT
WATER STERILE IRR 1000ML POUR (IV SOLUTION) ×3 IMPLANT

## 2021-02-17 NOTE — Anesthesia Postprocedure Evaluation (Signed)
Anesthesia Post Note  Patient: Martin Lopez  Procedure(s) Performed: ANTERIOR CERVICAL DECOMPRESSION/DISCECTOMY FUSION CERVICAL THREE-FOUR, CERVICAL FOUR-FIVE, CERVICAL FIVE-SIX (Spine Cervical)     Patient location during evaluation: PACU Anesthesia Type: General Level of consciousness: awake and alert Pain management: pain level controlled Vital Signs Assessment: post-procedure vital signs reviewed and stable Respiratory status: spontaneous breathing, nonlabored ventilation and respiratory function stable Cardiovascular status: blood pressure returned to baseline and stable Postop Assessment: no apparent nausea or vomiting Anesthetic complications: no   No notable events documented.  Last Vitals:  Vitals:   02/17/21 1420 02/17/21 1435  BP: 133/77 (!) 155/87  Pulse: 60 60  Resp: 12 15  Temp:    SpO2: 96% 96%    Last Pain:  Vitals:   02/17/21 1350  TempSrc:   PainSc: 6                  Aalyiah Camberos,W. EDMOND

## 2021-02-17 NOTE — Brief Op Note (Signed)
02/17/2021  1:35 PM  PATIENT:  Martin Lopez  76 y.o. male  PRE-OPERATIVE DIAGNOSIS:  Radiculopathy, Cervical region  POST-OPERATIVE DIAGNOSIS:  Radiculopathy, Cervical region  PROCEDURE:  Procedure(s): ANTERIOR CERVICAL DECOMPRESSION/DISCECTOMY FUSION CERVICAL THREE-FOUR, CERVICAL FOUR-FIVE, CERVICAL FIVE-SIX (N/A)  SURGEON:  Surgeon(s) and Role:    * [name], MD - Primary  PHYSICIAN ASSISTANT:   ASSISTANTSMearl Latin   ANESTHESIA:   general  EBL:  200 mL   BLOOD ADMINISTERED:none  DRAINS: none   LOCAL MEDICATIONS USED:  NONE  SPECIMEN:  No Specimen  DISPOSITION OF SPECIMEN:  N/A  COUNTS:  YES  TOURNIQUET:  * No tourniquets in log *  DICTATION: .Dragon Dictation  PLAN OF CARE: Admit to inpatient   PATIENT DISPOSITION:  PACU - hemodynamically stable.   Delay start of Pharmacological VTE agent (>24hrs) due to surgical blood loss or risk of bleeding: yes

## 2021-02-17 NOTE — H&P (Signed)
Martin Lopez is an 76 y.o. male.   Chief Complaint: Neck pain HPI: 76 year old male with chronic and persistent neck pain failing conservative management.  Patient with intermittent radicular symptoms into his upper extremities.  Work-up demonstrates evidence of marked cervical disc generation with degenerative spondylolisthesis C4-5 and C5-6 and significant spondylosis and stenosis at C3-4.  Patient presents now for 3 level anterior cervical decompression and fusion in hopes improving his symptoms.  Past Medical History:  Diagnosis Date   Allergy    Arthritis 2022   Basal cell carcinoma 01/05/2013   Left proximal pretibial.   Basal cell carcinoma 03/18/2013   Left mid back. Excised.   Basal cell carcinoma 03/22/2016   Left mid back. Pigmented.   Basal cell carcinoma 03/22/2016   LUQ abdomen. Superficial.   Basal cell carcinoma 03/28/2017   Left lateral knee. Superficial and nodular.    Basal cell carcinoma 06/03/2017   Left medial canthus. Nodular. Excised: 07/16/2017, margin focally involved.   Basal cell carcinoma 06/11/2018   Right post. lateral top of shoulder. Superficial and nodular patterns.   Basal cell carcinoma 06/11/2018   Left prox. med. pretibial sup. Nodular pattern.   Basal cell carcinoma 06/11/2018   Left prox. med. pretibial. inf. BCC with sclerosis   Basal cell carcinoma 12/10/2018   Left post. waistline 3cm lat to spine above sacrum. Superficial and nodular patterns.   Basal cell carcinoma 05/25/2020   right zygomatic area, [facility] 09/20/20   Bone spur    kneck   Bulging disc 2013   Cancer ([place])    basal cell ca - bil, bil legs, scalp, face   Dysplastic nevus 01/05/2013   Right superior sacral area. Moderate atypia, limited margins free.   GERD (gastroesophageal reflux disease)    HLD (hyperlipidemia)    HTN (hypertension)    Melanoma ([place]) 12/10/2018   Right distal deltoid. MM. Spindle cell/desmoplsastic melanoma. Breslow's 1.53mm, Clark's level III.    Squamous cell carcinoma of skin 06/10/2019   Left clavicle. WD SCC. EDC.   Squamous cell carcinoma of skin 09/16/2019   R prox forearm     Past Surgical History:  Procedure Laterality Date   BASAL CELL CARCINOMA EXCISION  2013   right leg,FOREHEAD   COLONOSCOPY  2016   COLONOSCOPY     GREEN LIGHT LASER TURP (TRANSURETHRAL RESECTION OF PROSTATE N/A 09/21/2014   Procedure: GREEN LIGHT LASER TURP (TRANSURETHRAL RESECTION OF PROSTATE;  Surgeon: [name], MD;  Location: [facility] ORS;  Service: Urology;  Laterality: N/A;   HEMORRHOID SURGERY N/A 02/28/2016   Procedure: HEMORRHOIDECTOMY;  Surgeon: [name], MD;  Location: [facility] ORS;  Service: General;  Laterality: N/A;   TONSILLECTOMY      Family History  Problem Relation Age of Onset   Ovarian cancer Mother    Cancer Father        [name]   Coronary artery disease Maternal Uncle        x2   Colon cancer Neg Hx    Esophageal cancer Neg Hx    Gallbladder disease Neg Hx    Rectal cancer Neg Hx    Stomach cancer Neg Hx    Social History:  reports that he has never smoked. He has never used smokeless tobacco. He reports current alcohol use of about 1.0 standard drink per week. He reports that he does not use drugs.  Allergies: No Known Allergies  Medications Prior to Admission  Medication Sig Dispense Refill   esomeprazole (NEXIUM) 40  MG capsule Take 40 mg by mouth daily before breakfast.     hydrocortisone (ANUSOL-HC) 2.5 % rectal cream Place 1 application rectally 2 (two) times daily. As needed. (Patient taking differently: Place 1 application rectally 2 (two) times daily as needed for hemorrhoids.) 30 g 3   ibuprofen (ADVIL,MOTRIN) 200 MG tablet Take 400 mg by mouth every 6 (six) hours as needed for mild pain or moderate pain.     irbesartan (AVAPRO) 300 MG tablet TAKE 1 TABLET DAILY (Patient taking differently: Take 150 mg by mouth at bedtime.) 90 tablet 2   Multiple Vitamins-Minerals (MULTIVITAMIN WITH MINERALS)  tablet Take 1 tablet by mouth daily.     rosuvastatin (CRESTOR) 10 MG tablet Take 10 mg by mouth every other day.      sildenafil (VIAGRA) 100 MG tablet Take 100 mg by mouth as needed.     terazosin (HYTRIN) 10 MG capsule Take 1 capsule (10 mg total) by mouth at bedtime.     zolpidem (AMBIEN) 5 MG tablet Take 5 mg by mouth at bedtime.     triamcinolone cream (KENALOG) 0.1 % Apply 1 application topically 2 (two) times daily as needed. 454 g 2   valACYclovir (VALTREX) 1000 MG tablet Take 1 tablet (1,000 mg total) by mouth as directed. Take 2 PO at onset and 2 PO 12 hours later (Patient taking differently: Take 1,000 mg by mouth daily as needed (breakouts).) 30 tablet 6    Results for orders placed or performed during the hospital encounter of 02/17/21 (from the past 48 hour(s))  ABO/Rh     Status: None   Collection Time: 02/17/21  9:15 AM  Result Value Ref Range   ABO/RH(D)      A POS Performed at [facility], [place] [address], [place], [place] [address]    No results found.  Pertinent items noted in HPI and remainder of comprehensive ROS otherwise negative.  Blood pressure (!) 162/89, pulse (!) 59, temperature 98 F (36.7 C), temperature source Oral, resp. rate 17, SpO2 98 %.  Patient is awake and alert.  He is oriented and appropriate.  Speech and language are fluent.  Judgment insight are intact.  Cranial nerve function normalized.  Motor examination intact motor and sensory examination nonfocal.  Deep intermix is normal active.  Noes long track signs.  Gait and posture normal.  Examination head ears eyes nose throat some are clear chest and abdomen are benign.  Extremities are free from injury or deformity. Assessment/Plan C3-4, C4-5, C5-6 severe spondylosis with degenerative spondylolisthesis and foraminal stenosis.  Plan C3-4, C4-5, C5-6 anterior cervical discectomy with interbody fusion utilizing interbody cages, local harvested autograft, and anterior plate instrumentation.   Risks and benefits of explained.  Patient wishes to proceed.  Mallie Mussel A Anicia Leuthold 02/17/2021, 10:35 AM

## 2021-02-17 NOTE — Op Note (Signed)
Date of procedure: 02/17/2021  Date of dictation: Same  Service: Neurosurgery  Preoperative diagnosis: Cervical spondylosis with foraminal stenosis and degenerative spondylolisthesis and stenosis.  Postoperative diagnosis: Same  Procedure Name: C3-4, C4-5, C5-6 anterior cervical discectomy with interbody fusion utilizing interbody cages, local harvested autograft, and anterior plate instrumentation.  Surgeon:Jordi Kamm A.Chava Dulac, M.D.  Asst. Surgeon: Reinaldo Meeker, NP  Anesthesia: General  Indication: 76 year old male with significant posterior cervical pain with radiation failing conservative management.  Work-up demonstrates evidence of marked cervical disc degeneration with disc base collapse and stenosis at C3-4, patient with unstable degenerative spondylolisthesis at C4-5 with stenosis both centrally and neuroforaminal he and significant disc generation with the space collapse and degenerative retrolisthesis at C5-6 with significant stenosis and neuroforaminal stenosis.  Patient has failed conservative management presents now for 3 level anterior cervical decompression and fusion in hopes of improving his symptoms.  Operative note: After induction of anesthesia, patient position supine with neck held in place halter traction.  Patient's anterior cervical region prepped and draped sterilely.  Incision made overlying C4-5.  Dissection performed on the right.  Retractor placed.  Fluoroscopy used.  Levels confirmed.  Disc base was incised with a 15 blade.  Discectomy was performed using various instruments down to level of the posterior annulus.  Microscope was brought to field used throughout the remainder of the discectomies.  Remaining aspects of annulus and osteophytes removed using high-speed drill down to level of the posterior longitudinal ligament.  Posterior logical was then elevated and resected piecemeal fashion.  Underlying thecal sac was identified.  A wide central decompression was then performed  undercutting the bodies of C3 and C4.  Decompression then proceeded to each neural foramina.  Wide anterior foraminotomies were performed along course exiting C4 nerve roots bilaterally.  At this point a very thorough decompression of been achieved.  There was no evidence of injury to the thecal sac and nerve roots.  Procedures then repeated at C4-5 and C5-6 again without complications.  Wound was then irrigated.  Gelfoam was placed topically for hemostasis then removed.  7 mm [employer] anatomic peek cages packed with locally harvested autograft were then impacted in place and recessed slightly from the anterior cortical margins at C3-4, C4-5 and C5-6.  Atlantis translational plate was then placed over the C3-C6 levels.  This then attached to fluoroscopic guidance using 13 mm fixed angle screws to each at all 4 levels.  All screws given final tightening found to be solidly within bone.  Locking screws engaged all levels.  Final images reveal good position the cages and hardware in proper upper level with normal alignment of spine.  Wound is then irrigated 1 final time.  Hemostasis was assured.  Wounds then closed in layers with Vicryl sutures.  Steri-Strips and sterile dressing were applied.  No apparent complications.  Patient tolerated the procedure well and he returns to recovery room postop.

## 2021-02-17 NOTE — Anesthesia Procedure Notes (Addendum)
Procedure Name: Intubation Date/Time: 02/17/2021 11:24 AM Performed by: Lavell Luster, CRNA Pre-anesthesia Checklist: Patient identified, Emergency Drugs available, Suction available and Patient being monitored Patient Re-evaluated:Patient Re-evaluated prior to induction Oxygen Delivery Method: Circle system utilized Preoxygenation: Pre-oxygenation with 100% oxygen Induction Type: IV induction Ventilation: Mask ventilation without difficulty Laryngoscope Size: Glidescope and 4 Grade View: Grade I Tube type: Oral Tube size: 7.5 mm Number of attempts: 1 Airway Equipment and Method: Patient positioned with wedge pillow and Rigid stylet Placement Confirmation: ETT inserted through vocal cords under direct vision, positive ETCO2 and breath sounds checked- equal and bilateral Secured at: 23 cm Tube secured with: Tape Dental Injury: Teeth and Oropharynx as per pre-operative assessment

## 2021-02-17 NOTE — Transfer of Care (Signed)
Immediate Anesthesia Transfer of Care Note  Patient: Martin Lopez  Procedure(s) Performed: ANTERIOR CERVICAL DECOMPRESSION/DISCECTOMY FUSION CERVICAL THREE-FOUR, CERVICAL FOUR-FIVE, CERVICAL FIVE-SIX (Spine Cervical)  Patient Location: PACU  Anesthesia Type:General  Level of Consciousness: awake, alert  and oriented  Airway & Oxygen Therapy: Patient Spontanous Breathing  Post-op Assessment: Report given to RN and Post -op Vital signs reviewed and stable  Post vital signs: Reviewed and stable  Last Vitals:  Vitals Value Taken Time  BP 140/69 02/17/21 1349  Temp    Pulse 60 02/17/21 1350  Resp 15 02/17/21 1350  SpO2 94 % 02/17/21 1350  Vitals shown include unvalidated device data.  Last Pain:  Vitals:   02/17/21 0913  TempSrc:   PainSc: 0-No pain         Complications: No notable events documented.

## 2021-02-18 MED ORDER — HYDROCODONE-ACETAMINOPHEN 10-325 MG PO TABS: 1.0000 | ORAL_TABLET | ORAL | 0 refills | Status: AC | PRN

## 2021-02-18 NOTE — Discharge Summary (Signed)
Physician Discharge Summary  Patient ID: Martin Lopez MRN: [id number] DOB/AGE: August 26, 1945 76 y.o.  Admit date: 02/17/2021 Discharge date: 02/18/2021  Admission Diagnoses: Cervical spondylosis with myelopathy and radiculopathy  Discharge Diagnoses:  Principal Problem:   Cervical spondylosis with myelopathy and radiculopathy   Discharged Condition: good  Hospital Course: Dr. Ellene Route performed a C3-4, C4-5 and C5-6 anterior cervical discectomy fusion and plating on the patient on 02/17/2021.  The patient's postoperative course was unremarkable.  On postoperative day 1 he requested discharge home.  He was given written and discharge instructions.  All his questions were answered.  Consults: PT, OT, care management Significant Diagnostic Studies: None Treatments: C3-4, C4-5 and C5-6 anterior cervicectomy fusion and plating. Discharge Exam: Blood pressure 109/73, pulse 99, temperature 98 F (36.7 C), resp. rate 16, SpO2 99 %. The patient is alert and pleasant.  He looks well.  He is moving all 4 extremities well.  His dressing is clean and dry.  There is no hematoma or shift.  Disposition: Home  Discharge Instructions     Call MD for:  difficulty breathing, headache or visual disturbances   Complete by: As directed    Call MD for:  extreme fatigue   Complete by: As directed    Call MD for:  hives   Complete by: As directed    Call MD for:  persistant dizziness or light-headedness   Complete by: As directed    Call MD for:  persistant nausea and vomiting   Complete by: As directed    Call MD for:  redness, tenderness, or signs of infection (pain, swelling, redness, odor or green/yellow discharge around incision site)   Complete by: As directed    Call MD for:  severe uncontrolled pain   Complete by: As directed    Call MD for:  temperature >100.4   Complete by: As directed    Diet - low sodium heart healthy   Complete by: As directed    Discharge instructions   Complete by:  As directed    Call [phone] for a followup appointment. Take a stool softener while you are using pain medications.   Driving Restrictions   Complete by: As directed    Do not drive for 2 weeks.   Increase activity slowly   Complete by: As directed    Lifting restrictions   Complete by: As directed    Do not lift more than 5 pounds. No excessive bending or twisting.   May shower / Bathe   Complete by: As directed    Remove the dressing for 3 days after surgery.  You may shower, but leave the incision alone.   Remove dressing in 48 hours   Complete by: As directed       Allergies as of 02/18/2021   No Known Allergies      Medication List     STOP taking these medications    ibuprofen 200 MG tablet Commonly known as: ADVIL   zolpidem 5 MG tablet Commonly known as: AMBIEN       TAKE these medications    esomeprazole 40 MG capsule Commonly known as: NEXIUM Take 40 mg by mouth daily before breakfast.   HYDROcodone-acetaminophen 10-325 MG tablet Commonly known as: NORCO Take 1 tablet by mouth every 4 (four) hours as needed for severe pain ((score 7 to 10)).   hydrocortisone 2.5 % rectal cream Commonly known as: Anusol-HC Place 1 application rectally 2 (two) times daily. As needed. What changed:  when  to take this reasons to take this additional instructions   irbesartan 300 MG tablet Commonly known as: AVAPRO TAKE 1 TABLET DAILY What changed:  how much to take when to take this   multivitamin with minerals tablet Take 1 tablet by mouth daily.   rosuvastatin 10 MG tablet Commonly known as: CRESTOR Take 10 mg by mouth every other day.   sildenafil 100 MG tablet Commonly known as: VIAGRA Take 100 mg by mouth as needed.   terazosin 10 MG capsule Commonly known as: HYTRIN Take 1 capsule (10 mg total) by mouth at bedtime.   triamcinolone cream 0.1 % Commonly known as: KENALOG Apply 1 application topically 2 (two) times daily as needed.    valACYclovir 1000 MG tablet Commonly known as: VALTREX Take 1 tablet (1,000 mg total) by mouth as directed. Take 2 PO at onset and 2 PO 12 hours later What changed:  when to take this reasons to take this additional instructions         Signed: Ophelia Charter 02/18/2021, 8:26 AM

## 2021-02-18 NOTE — Progress Notes (Signed)
Patient awaiting transport via wheelchair by NT for discharge home; in no acute distress nor complaints of pain nor discomfort; incision on his anterior neck with gauze dressing and is clean, dry and intact with soft collar on; room was checked and accounted for all his belongings; discharge instructions concerning his medications, incision care, follow up appointment and when to call the doctor as needed were all discussed with patient and he expressed understanding on the instructions given.

## 2021-02-18 NOTE — Plan of Care (Signed)

## 2021-02-18 NOTE — Evaluation (Signed)
Occupational Therapy Evaluation Patient Details Name: Martin Lopez MRN: [id number] DOB: Jul 11, 1945 Today's Date: 02/18/2021   History of Present Illness Pt is a 76 yo male s/p ACDF C3-4, 4-5, 5-6. PMHx: HTN, arthritis.   Clinical Impression   PLOF: Pt lives alone and is independent with ADL and mobility. Pt currently, independent with using hip hike and figure 4 position technique for all lower body ADL tasks. And 2 cup technique for brushing teeth. Pt is efficient with donning/doffing soft collar.  Back handout provided and reviewed ADL in detail. Pt educated on: set an alarm at night for medication, avoid sitting for long periods of time, correct bed positioning for sleeping, correct sequence for bed mobility, avoiding lifting more than 5 pounds and never wash directly over incision. All education is complete and patient indicates understanding. OT eval only. D/C OT.      Recommendations for follow up therapy are one component of a multi-disciplinary discharge planning process, led by the attending physician.  Recommendations may be updated based on patient status, additional functional criteria and insurance authorization.   Follow Up Recommendations  No OT follow up    Assistance Recommended at Discharge None  Patient can return home with the following Assist for transportation    Functional Status Assessment  Patient has had a recent decline in their functional status and demonstrates the ability to make significant improvements in function in a reasonable and predictable amount of time.  Equipment Recommendations  None recommended by OT    Recommendations for Other Services       Precautions / Restrictions Precautions Precautions: Cervical Precaution Booklet Issued: Yes (comment) Precaution Comments: education and pt able to recall "BLT" and no arching Required Braces or Orthoses: Cervical Brace Cervical Brace: Soft collar;For comfort Restrictions Weight Bearing  Restrictions: No      Mobility Bed Mobility Overal bed mobility: Independent             General bed mobility comments: log roll    Transfers Overall transfer level: Independent Equipment used: None                      Balance Overall balance assessment: Modified Independent                                         ADL either performed or assessed with clinical judgement   ADL Overall ADL's : Modified independent                                       General ADL Comments: Using hip hike and figure 4 position technique for all LB ADL tasks.     Vision Baseline Vision/History: 1 Wears glasses Ability to See in Adequate Light: 0 Adequate Patient Visual Report: No change from baseline Vision Assessment?: No apparent visual deficits     Perception     Praxis      Pertinent Vitals/Pain Pain Assessment Pain Assessment: 0-10 Pain Score: 3  Pain Location: top of shoulder and back of neck Pain Descriptors / Indicators: Discomfort, Sore Pain Intervention(s): Monitored during session     Hand Dominance Right   Extremity/Trunk Assessment Upper Extremity Assessment Upper Extremity Assessment: Overall WFL for tasks assessed   Lower Extremity Assessment Lower Extremity Assessment: Overall WFL for tasks assessed  Cervical / Trunk Assessment Cervical / Trunk Assessment: Normal   Communication Communication Communication: No difficulties   Cognition Arousal/Alertness: Awake/alert Behavior During Therapy: WFL for tasks assessed/performed Overall Cognitive Status: Within Functional Limits for tasks assessed                                       General Comments  reports having friend stay with him x1 night to assist as needed    Exercises     Shoulder Instructions      Home Living Family/patient expects to be discharged to:: Private residence Living Arrangements: Alone Available Help at Discharge:  Family;Available PRN/intermittently Type of Home: House Home Access: Level entry;Ramped entrance     Home Layout: One level     Bathroom Shower/Tub: Occupational psychologist: Standard     Home Equipment: Shower seat - built in          Prior Functioning/Environment Prior Level of Function : Independent/Modified Independent             Mobility Comments: no AD, independent ADLs Comments: Independent ADL/iADL        OT Problem List: Decreased activity tolerance;Pain      OT Treatment/Interventions:      OT Goals(Current goals can be found in the care plan section) Acute Rehab OT Goals Patient Stated Goal: to go home OT Goal Formulation: All assessment and education complete, DC therapy Potential to Achieve Goals: Good  OT Frequency:      Co-evaluation              AM-PAC OT "6 Clicks" Daily Activity     Outcome Measure Help from another person eating meals?: None Help from another person taking care of personal grooming?: None Help from another person toileting, which includes using toliet, bedpan, or urinal?: None Help from another person bathing (including washing, rinsing, drying)?: A Little Help from another person to put on and taking off regular upper body clothing?: None Help from another person to put on and taking off regular lower body clothing?: None 6 Click Score: 23   End of Session Equipment Utilized During Treatment: Cervical collar Nurse Communication: Mobility status  Activity Tolerance: Patient tolerated treatment well Patient left: in chair;with call bell/phone within reach  OT Visit Diagnosis: Muscle weakness (generalized) (M62.81);Pain Pain - part of body:  (neck)                Time: 2353-6144 OT Time Calculation (min): 31 min Charges:  OT General Charges $OT Visit: 1 Visit OT Evaluation $OT Eval Moderate Complexity: 1 Mod OT Treatments $Self Care/Home Management : 8-22 mins  Jefferey Pica, OTR/L Acute  Rehabilitation Services Pager: [phone] Office: [phone]   Annamary Buschman C 02/18/2021, 8:11 AM

## 2021-02-18 NOTE — TOC Transition Note (Signed)
Transition of Care [facility]) - CM/SW Discharge Note   Patient Details  Name: Martin Lopez MRN: [id number] Date of Birth: [birth year]  [facility] CM/SW Contact:  Carles Collet, RN Phone Number: 02/18/2021, 9:00 AM   Clinical Narrative:    Patient pre assigned through office for [facility] through [place].  Unit staff to provide DME needed from [employer]stock. No other TOC needs identified    Final next level of care: Home/Self Care     Patient Goals and CMS Choice        Discharge Placement                       Discharge Plan and Services                                     Social Determinants of Health (SDOH) Interventions     Readmission Risk Interventions No flowsheet data found.

## 2021-02-20 ENCOUNTER — Encounter (HOSPITAL_COMMUNITY): Payer: Self-pay | Admitting: Neurosurgery

## 2021-07-31 ENCOUNTER — Ambulatory Visit (INDEPENDENT_AMBULATORY_CARE_PROVIDER_SITE_OTHER): Payer: Medicare Other | Admitting: Dermatology

## 2021-07-31 DIAGNOSIS — Z85828 Personal history of other malignant neoplasm of skin: Secondary | ICD-10-CM | POA: Diagnosis not present

## 2021-07-31 DIAGNOSIS — Z1283 Encounter for screening for malignant neoplasm of skin: Secondary | ICD-10-CM

## 2021-07-31 DIAGNOSIS — D229 Melanocytic nevi, unspecified: Secondary | ICD-10-CM | POA: Diagnosis not present

## 2021-07-31 DIAGNOSIS — L814 Other melanin hyperpigmentation: Secondary | ICD-10-CM

## 2021-07-31 DIAGNOSIS — L821 Other seborrheic keratosis: Secondary | ICD-10-CM

## 2021-07-31 DIAGNOSIS — L820 Inflamed seborrheic keratosis: Secondary | ICD-10-CM

## 2021-07-31 DIAGNOSIS — L570 Actinic keratosis: Secondary | ICD-10-CM

## 2021-07-31 DIAGNOSIS — D1800 Hemangioma unspecified site: Secondary | ICD-10-CM

## 2021-07-31 DIAGNOSIS — Z85820 Personal history of malignant melanoma of skin: Secondary | ICD-10-CM

## 2021-07-31 DIAGNOSIS — L578 Other skin changes due to chronic exposure to nonionizing radiation: Secondary | ICD-10-CM

## 2021-07-31 NOTE — Progress Notes (Unsigned)
Follow-Up Visit   Subjective  Martin Lopez is a 76 y.o. male who presents for the following: Annual Exam (6 month tbse,  hx of melanoma, hx of bcc, hx of scc, hx of isks and aks.  Reports some spots that he would like checked at right hand and other locations he would prefer to share with provider. ). The patient complains of rough spots on the trunk and other areas that are irritating and growing and he would like treated. The patient presents for Total-Body Skin Exam (TBSE) for skin cancer screening and mole check.  The patient has spots, moles and lesions to be evaluated, some may be new or changing and the patient has concerns that these could be cancer.  The following portions of the chart were reviewed this encounter and updated as appropriate:  Tobacco  Allergies  Meds  Problems  Med Hx  Surg Hx  Fam Hx     Review of Systems: No other skin or systemic complaints except as noted in HPI or Assessment and Plan.  Objective  Well appearing patient in no apparent distress; mood and affect are within normal limits.  A full examination was performed including scalp, head, eyes, ears, nose, lips, neck, chest, axillae, abdomen, back, buttocks, bilateral upper extremities, bilateral lower extremities, hands, feet, fingers, toes, fingernails, and toenails. All findings within normal limits unless otherwise noted below.  Right hand base of thumb x 1 , right posterior deltoid x 2, right scalp x 2, chest x 21 (26) Erythematous stuck-on, waxy papule or plaque  right scalp x 2,  left ear x 1, left forehead x 2 (5) Erythematous thin papules/macules with gritty scale. Erythematous thin papules/macules with gritty scale.    Assessment & Plan  Inflamed seborrheic keratosis (26) Right hand base of thumb x 1 , right posterior deltoid x 2, right scalp x 2, chest x 21 Symptomatic, irritating, patient would like treated.  Destruction of lesion - Right hand base of thumb x 1 , right posterior  deltoid x 2, right scalp x 2, chest x 21 Complexity: simple   Destruction method: cryotherapy   Informed consent: discussed and consent obtained   Timeout:  patient name, date of birth, surgical site, and procedure verified Lesion destroyed using liquid nitrogen: Yes   Region frozen until ice ball extended beyond lesion: Yes   Outcome: patient tolerated procedure well with no complications   Post-procedure details: wound care instructions given   Additional details:  Prior to procedure, discussed risks of blister formation, small wound, skin dyspigmentation, or rare scar following cryotherapy. Recommend Vaseline ointment to treated areas while healing.  Actinic keratosis (5) right scalp x 2,  left ear x 1, left forehead x 2 Actinic keratoses are precancerous spots that appear secondary to cumulative UV radiation exposure/sun exposure over time. They are chronic with expected duration over 1 year. A portion of actinic keratoses will progress to squamous cell carcinoma of the skin. It is not possible to reliably predict which spots will progress to skin cancer and so treatment is recommended to prevent development of skin cancer. Recommend daily broad spectrum sunscreen SPF 30+ to sun-exposed areas, reapply every 2 hours as needed.  Recommend staying in the shade or wearing long sleeves, sun glasses (UVA+UVB protection) and wide brim hats (4-inch brim around the entire circumference of the hat). Call for new or changing lesions.  Destruction of lesion - right scalp x 2,  left ear x 1, left forehead x 2 Complexity:  simple   Destruction method: cryotherapy   Informed consent: discussed and consent obtained   Timeout:  patient name, date of birth, surgical site, and procedure verified Lesion destroyed using liquid nitrogen: Yes   Region frozen until ice ball extended beyond lesion: Yes   Outcome: patient tolerated procedure well with no complications   Post-procedure details: wound care  instructions given   Additional details:  Prior to procedure, discussed risks of blister formation, small wound, skin dyspigmentation, or rare scar following cryotherapy. Recommend Vaseline ointment to treated areas while healing.  Lentigines - Scattered tan macules - Due to sun exposure - Benign-appearing, observe - Recommend daily broad spectrum sunscreen SPF 30+ to sun-exposed areas, reapply every 2 hours as needed. - Call for any changes  Seborrheic Keratoses - Stuck-on, waxy, tan-brown papules and/or plaques  - Benign-appearing - Discussed benign etiology and prognosis. - Observe - Call for any changes  Melanocytic Nevi - Tan-brown and/or pink-flesh-colored symmetric macules and papules - Benign appearing on exam today - Observation - Call clinic for new or changing moles - Recommend daily use of broad spectrum spf 30+ sunscreen to sun-exposed areas.   Hemangiomas - Red papules - Discussed benign nature - Observe - Call for any changes  Actinic Damage - Chronic condition, secondary to cumulative UV/sun exposure - diffuse scaly erythematous macules with underlying dyspigmentation - Recommend daily broad spectrum sunscreen SPF 30+ to sun-exposed areas, reapply every 2 hours as needed.  - Staying in the shade or wearing long sleeves, sun glasses (UVA+UVB protection) and wide brim hats (4-inch brim around the entire circumference of the hat) are also recommended for sun protection.  - Call for new or changing lesions.  History of invasive metastatic melanoma Hx of right distal deltoid 2020 Patient is followed by [facility] as well  Reviewed [facility] records today: CT of Chest and abdomen showed no metastatic disease on 05/09/2021. Pt is no longer on any immunotherapy or other treatments at this time. - No evidence of recurrence today - No lymphadenopathy - Recommend regular full body skin exams - Recommend daily broad spectrum sunscreen SPF 30+ to sun-exposed areas, reapply every 2  hours as needed.  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  - Call if any new or changing lesions are noted between office visits  Skin cancer screening performed today. Return in about 6 months (around 01/31/2022) for TBSE. IRuthell Rummage, CMA, am acting as scribe for [name], MD. Documentation: I have reviewed the above documentation for accuracy and completeness, and I agree with the above.  [name], MD

## 2021-07-31 NOTE — Patient Instructions (Addendum)
Seborrheic Keratosis  What causes seborrheic keratoses? Seborrheic keratoses are harmless, common skin growths that first appear during adult life.  As time goes by, more growths appear.  Some people may develop a large number of them.  Seborrheic keratoses appear on both covered and uncovered body parts.  They are not caused by sunlight.  The tendency to develop seborrheic keratoses can be inherited.  They vary in color from skin-colored to gray, brown, or even black.  They can be either smooth or have a rough, warty surface.   Seborrheic keratoses are superficial and look as if they were stuck on the skin.  Under the microscope this type of keratosis looks like layers upon layers of skin.  That is why at times the top layer may seem to fall off, but the rest of the growth remains and re-grows.    Treatment Seborrheic keratoses do not need to be treated, but can easily be removed in the office.  Seborrheic keratoses often cause symptoms when they rub on clothing or jewelry.  Lesions can be in the way of shaving.  If they become inflamed, they can cause itching, soreness, or burning.  Removal of a seborrheic keratosis can be accomplished by freezing, burning, or surgery. If any spot bleeds, scabs, or grows rapidly, please return to have it checked, as these can be an indication of a skin cancer.  Cryotherapy Aftercare  Wash gently with soap and water everyday.   Apply Vaseline and Band-Aid daily until healed.   Actinic keratoses are precancerous spots that appear secondary to cumulative UV radiation exposure/sun exposure over time. They are chronic with expected duration over 1 year. A portion of actinic keratoses will progress to squamous cell carcinoma of the skin. It is not possible to reliably predict which spots will progress to skin cancer and so treatment is recommended to prevent development of skin cancer.  Recommend daily broad spectrum sunscreen SPF 30+ to sun-exposed areas, reapply every  2 hours as needed.  Recommend staying in the shade or wearing long sleeves, sun glasses (UVA+UVB protection) and wide brim hats (4-inch brim around the entire circumference of the hat). Call for new or changing lesions.    Melanoma ABCDEs  Melanoma is the most dangerous type of skin cancer, and is the leading cause of death from skin disease.  You are more likely to develop melanoma if you: Have light-colored skin, light-colored eyes, or red or blond hair Spend a lot of time in the sun Tan regularly, either outdoors or in a tanning bed Have had blistering sunburns, especially during childhood Have a close family member who has had a melanoma Have atypical moles or large birthmarks  Early detection of melanoma is key since treatment is typically straightforward and cure rates are extremely high if we catch it early.   The first sign of melanoma is often a change in a mole or a new dark spot.  The ABCDE system is a way of remembering the signs of melanoma.  A for asymmetry:  The two halves do not match. B for border:  The edges of the growth are irregular. C for color:  A mixture of colors are present instead of an even brown color. D for diameter:  Melanomas are usually (but not always) greater than 6mm - the size of a pencil eraser. E for evolution:  The spot keeps changing in size, shape, and color.  Please check your skin once per month between visits. You can use a small   mirror in front and a large mirror behind you to keep an eye on the back side or your body.   If you see any new or changing lesions before your next follow-up, please call to schedule a visit.  Please continue daily skin protection including broad spectrum sunscreen SPF 30+ to sun-exposed areas, reapplying every 2 hours as needed when you're outdoors.   Staying in the shade or wearing long sleeves, sun glasses (UVA+UVB protection) and wide brim hats (4-inch brim around the entire circumference of the hat) are also  recommended for sun protection.     Due to recent changes in healthcare laws, you may see results of your pathology and/or laboratory studies on MyChart before the doctors have had a chance to review them. We understand that in some cases there may be results that are confusing or concerning to you. Please understand that not all results are received at the same time and often the doctors may need to interpret multiple results in order to provide you with the best plan of care or course of treatment. Therefore, we ask that you please give us 2 business days to thoroughly review all your results before contacting the office for clarification. Should we see a critical lab result, you will be contacted sooner.   If You Need Anything After Your Visit  If you have any questions or concerns for your doctor, please call our main line at [phone] and press option 4 to reach your doctor's medical assistant. If no one answers, please leave a voicemail as directed and we will return your call as soon as possible. Messages left after 4 pm will be answered the following business day.   You may also send us a message via MyChart. We typically respond to MyChart messages within 1-2 business days.  For prescription refills, please ask your pharmacy to contact our office. Our fax number is [phone].  If you have an urgent issue when the clinic is closed that cannot wait until the next business day, you can page your doctor at the number below.    Please note that while we do our best to be available for urgent issues outside of office hours, we are not available 24/7.   If you have an urgent issue and are unable to reach us, you may choose to seek medical care at your doctor's office, retail clinic, urgent care center, or emergency room.  If you have a medical emergency, please immediately call 911 or go to the emergency department.  Pager Numbers  - Dr. Kowalski: [phone]  - Dr. Moye:  [phone]  - Dr. Stewart: [phone]  In the event of inclement weather, please call our main line at [phone] for an update on the status of any delays or closures.  Dermatology Medication Tips: Please keep the boxes that topical medications come in in order to help keep track of the instructions about where and how to use these. Pharmacies typically print the medication instructions only on the boxes and not directly on the medication tubes.   If your medication is too expensive, please contact our office at [phone] option 4 or send us a message through MyChart.   We are unable to tell what your co-pay for medications will be in advance as this is different depending on your insurance coverage. However, we may be able to find a substitute medication at lower cost or fill out paperwork to get insurance to cover a needed medication.   If a prior   authorization is required to get your medication covered by your insurance company, please allow us 1-2 business days to complete this process.  Drug prices often vary depending on where the prescription is filled and some pharmacies may offer cheaper prices.  The website www.goodrx.com contains coupons for medications through different pharmacies. The prices here do not account for what the cost may be with help from insurance (it may be cheaper with your insurance), but the website can give you the price if you did not use any insurance.  - You can print the associated coupon and take it with your prescription to the pharmacy.  - You may also stop by our office during regular business hours and pick up a GoodRx coupon card.  - If you need your prescription sent electronically to a different pharmacy, notify our office through [facility] MyChart or by phone at [phone] option 4.     Si Usted Necesita Algo Despus de Su Visita  Tambin puede enviarnos un mensaje a travs de MyChart. Por lo general respondemos a los mensajes de  MyChart en el transcurso de 1 a 2 das hbiles.  Para renovar recetas, por favor pida a su farmacia que se ponga en contacto con nuestra oficina. Nuestro nmero de fax es el [phone].  Si tiene un asunto urgente cuando la clnica est cerrada y que no puede esperar hasta el siguiente da hbil, puede llamar/localizar a su doctor(a) al nmero que aparece a continuacin.   Por favor, tenga en cuenta que aunque hacemos todo lo posible para estar disponibles para asuntos urgentes fuera del horario de oficina, no estamos disponibles las 24 horas del da, los 7 das de la semana.   Si tiene un problema urgente y no puede comunicarse con nosotros, puede optar por buscar atencin mdica  en el consultorio de su doctor(a), en una clnica privada, en un centro de atencin urgente o en una sala de emergencias.  Si tiene una emergencia mdica, por favor llame inmediatamente al 911 o vaya a la sala de emergencias.  Nmeros de bper  - Dr. Kowalski: [phone]  - Dra. Moye: [phone]  - Dra. Stewart: [phone]  En caso de inclemencias del tiempo, por favor llame a nuestra lnea principal al [phone] para una actualizacin sobre el estado de cualquier retraso o cierre.  Consejos para la medicacin en dermatologa: Por favor, guarde las cajas en las que vienen los medicamentos de uso tpico para ayudarle a seguir las instrucciones sobre dnde y cmo usarlos. Las farmacias generalmente imprimen las instrucciones del medicamento slo en las cajas y no directamente en los tubos del medicamento.   Si su medicamento es muy caro, por favor, pngase en contacto con nuestra oficina llamando al [phone] y presione la opcin 4 o envenos un mensaje a travs de MyChart.   No podemos decirle cul ser su copago por los medicamentos por adelantado ya que esto es diferente dependiendo de la cobertura de su seguro. Sin embargo, es posible que podamos encontrar un medicamento sustituto a menor costo o  llenar un formulario para que el seguro cubra el medicamento que se considera necesario.   Si se requiere una autorizacin previa para que su compaa de seguros cubra su medicamento, por favor permtanos de 1 a 2 das hbiles para completar este proceso.  Los precios de los medicamentos varan con frecuencia dependiendo del lugar de dnde se surte la receta y alguna farmacias pueden ofrecer precios ms baratos.  El sitio web www.goodrx.com tiene cupones para   medicamentos de diferentes farmacias. Los precios aqu no tienen en cuenta lo que podra costar con la ayuda del seguro (puede ser ms barato con su seguro), pero el sitio web puede darle el precio si no utiliz ningn seguro.  - Puede imprimir el cupn correspondiente y llevarlo con su receta a la farmacia.  - Tambin puede pasar por nuestra oficina durante el horario de atencin regular y recoger una tarjeta de cupones de GoodRx.  - Si necesita que su receta se enve electrnicamente a una farmacia diferente, informe a nuestra oficina a travs de MyChart de [facility] o por telfono llamando al [phone] y presione la opcin 4.  

## 2021-08-01 ENCOUNTER — Encounter: Payer: Self-pay | Admitting: Dermatology

## 2021-11-29 ENCOUNTER — Ambulatory Visit (INDEPENDENT_AMBULATORY_CARE_PROVIDER_SITE_OTHER): Payer: Medicare Other | Admitting: Dermatology

## 2021-11-29 DIAGNOSIS — D492 Neoplasm of unspecified behavior of bone, soft tissue, and skin: Secondary | ICD-10-CM

## 2021-11-29 DIAGNOSIS — L821 Other seborrheic keratosis: Secondary | ICD-10-CM | POA: Diagnosis not present

## 2021-11-29 DIAGNOSIS — C44519 Basal cell carcinoma of skin of other part of trunk: Secondary | ICD-10-CM

## 2021-11-29 DIAGNOSIS — L578 Other skin changes due to chronic exposure to nonionizing radiation: Secondary | ICD-10-CM

## 2021-11-29 NOTE — Progress Notes (Signed)
   Follow-Up Visit   Subjective  Martin Lopez is a 76 y.o. male who presents for the following: Skin Problem (The patient has  lesion on the chest  to be evaluated, some may be new or changing and the patient has concerns that these could be cancer. ). The patient has spots, moles and lesions to be evaluated, some may be new or changing and the patient has concerns that these could be cancer.  The following portions of the chart were reviewed this encounter and updated as appropriate:   Tobacco  Allergies  Meds  Problems  Med Hx  Surg Hx  Fam Hx     Review of Systems:  No other skin or systemic complaints except as noted in HPI or Assessment and Plan.  Objective  Well appearing patient in no apparent distress; mood and affect are within normal limits.  A focused examination was performed including chest. Relevant physical exam findings are noted in the Assessment and Plan.  left chest parasternal 1.1 cm erythematous papule           Assessment & Plan  Neoplasm of skin left chest parasternal  Epidermal / dermal shaving  Lesion diameter (cm):  1.1 Informed consent: discussed and consent obtained   Timeout: patient name, date of birth, surgical site, and procedure verified   Procedure prep:  Patient was prepped and draped in usual sterile fashion Prep type:  Isopropyl alcohol Anesthesia: the lesion was anesthetized in a standard fashion   Anesthetic:  1% lidocaine w/ epinephrine 1-100,000 buffered w/ 8.4% NaHCO3 Hemostasis achieved with: pressure, aluminum chloride and electrodesiccation   Outcome: patient tolerated procedure well   Post-procedure details: sterile dressing applied and wound care instructions given   Dressing type: bandage and petrolatum    Destruction of lesion  Destruction method: electrodesiccation and curettage   Informed consent: discussed and consent obtained   Timeout:  patient name, date of birth, surgical site, and procedure  verified Anesthesia: the lesion was anesthetized in a standard fashion   Anesthetic:  1% lidocaine w/ epinephrine 1-100,000 buffered w/ 8.4% NaHCO3 Curettage performed in three different directions: Yes   Electrodesiccation performed over the curetted area: Yes   Curettage cycles:  3 Lesion length (cm):  1.1 Lesion width (cm):  1.1 Margin per side (cm):  0.2 Final wound size (cm):  1.5 Hemostasis achieved with:  electrodesiccation Outcome: patient tolerated procedure well with no complications   Post-procedure details: sterile dressing applied and wound care instructions given   Dressing type: petrolatum    Specimen 1 - Surgical pathology Differential Diagnosis: R/O Skin cancer   Check Margins: No  Actinic Damage - chronic, secondary to cumulative UV radiation exposure/sun exposure over time - diffuse scaly erythematous macules with underlying dyspigmentation - Recommend daily broad spectrum sunscreen SPF 30+ to sun-exposed areas, reapply every 2 hours as needed.  - Recommend staying in the shade or wearing long sleeves, sun glasses (UVA+UVB protection) and wide brim hats (4-inch brim around the entire circumference of the hat). - Call for new or changing lesions.  Seborrheic Keratoses - Stuck-on, waxy, tan-brown papules and/or plaques  - Benign-appearing - Discussed benign etiology and prognosis. - Observe - Call for any changes  Return for as scheduled January 2024.  IMarye Round, CMA, am acting as scribe for [name], MD .  Documentation: I have reviewed the above documentation for accuracy and completeness, and I agree with the above.  [name], MD

## 2021-11-29 NOTE — Patient Instructions (Signed)
Wound Care Instructions  Cleanse wound gently with soap and water once a day then pat dry with clean gauze. Apply a thin coat of Petrolatum (petroleum jelly, "Vaseline") over the wound (unless you have an allergy to this). We recommend that you use a new, sterile tube of Vaseline. Do not pick or remove scabs. Do not remove the yellow or white "healing tissue" from the base of the wound.  Cover the wound with fresh, clean, nonstick gauze and secure with paper tape. You may use Band-Aids in place of gauze and tape if the wound is small enough, but would recommend trimming much of the tape off as there is often too much. Sometimes Band-Aids can irritate the skin.  You should call the office for your biopsy report after 1 week if you have not already been contacted.  If you experience any problems, such as abnormal amounts of bleeding, swelling, significant bruising, significant pain, or evidence of infection, please call the office immediately.  FOR ADULT SURGERY PATIENTS: If you need something for pain relief you may take 1 extra strength Tylenol (acetaminophen) AND 2 Ibuprofen (200mg each) together every 4 hours as needed for pain. (do not take these if you are allergic to them or if you have a reason you should not take them.) Typically, you may only need pain medication for 1 to 3 days.     Due to recent changes in healthcare laws, you may see results of your pathology and/or laboratory studies on MyChart before the doctors have had a chance to review them. We understand that in some cases there may be results that are confusing or concerning to you. Please understand that not all results are received at the same time and often the doctors may need to interpret multiple results in order to provide you with the best plan of care or course of treatment. Therefore, we ask that you please give us 2 business days to thoroughly review all your results before contacting the office for clarification. Should  we see a critical lab result, you will be contacted sooner.   If You Need Anything After Your Visit  If you have any questions or concerns for your doctor, please call our main line at [phone] and press option 4 to reach your doctor's medical assistant. If no one answers, please leave a voicemail as directed and we will return your call as soon as possible. Messages left after 4 pm will be answered the following business day.   You may also send us a message via MyChart. We typically respond to MyChart messages within 1-2 business days.  For prescription refills, please ask your pharmacy to contact our office. Our fax number is [phone].  If you have an urgent issue when the clinic is closed that cannot wait until the next business day, you can page your doctor at the number below.    Please note that while we do our best to be available for urgent issues outside of office hours, we are not available 24/7.   If you have an urgent issue and are unable to reach us, you may choose to seek medical care at your doctor's office, retail clinic, urgent care center, or emergency room.  If you have a medical emergency, please immediately call 911 or go to the emergency department.  Pager Numbers  - Dr. Kowalski: [phone]  - Dr. Moye: [phone]  - Dr. Stewart: [phone]  In the event of inclement weather, please call our main line at   [phone] for an update on the status of any delays or closures.  Dermatology Medication Tips: Please keep the boxes that topical medications come in in order to help keep track of the instructions about where and how to use these. Pharmacies typically print the medication instructions only on the boxes and not directly on the medication tubes.   If your medication is too expensive, please contact our office at [phone] option 4 or send us a message through MyChart.   We are unable to tell what your co-pay for medications will be in  advance as this is different depending on your insurance coverage. However, we may be able to find a substitute medication at lower cost or fill out paperwork to get insurance to cover a needed medication.   If a prior authorization is required to get your medication covered by your insurance company, please allow us 1-2 business days to complete this process.  Drug prices often vary depending on where the prescription is filled and some pharmacies may offer cheaper prices.  The website www.goodrx.com contains coupons for medications through different pharmacies. The prices here do not account for what the cost may be with help from insurance (it may be cheaper with your insurance), but the website can give you the price if you did not use any insurance.  - You can print the associated coupon and take it with your prescription to the pharmacy.  - You may also stop by our office during regular business hours and pick up a GoodRx coupon card.  - If you need your prescription sent electronically to a different pharmacy, notify our office through [facility] MyChart or by phone at [phone] option 4.     Si Usted Necesita Algo Despus de Su Visita  Tambin puede enviarnos un mensaje a travs de MyChart. Por lo general respondemos a los mensajes de MyChart en el transcurso de 1 a 2 das hbiles.  Para renovar recetas, por favor pida a su farmacia que se ponga en contacto con nuestra oficina. Nuestro nmero de fax es el [phone].  Si tiene un asunto urgente cuando la clnica est cerrada y que no puede esperar hasta el siguiente da hbil, puede llamar/localizar a su doctor(a) al nmero que aparece a continuacin.   Por favor, tenga en cuenta que aunque hacemos todo lo posible para estar disponibles para asuntos urgentes fuera del horario de oficina, no estamos disponibles las 24 horas del da, los 7 das de la semana.   Si tiene un problema urgente y no puede comunicarse con nosotros, puede  optar por buscar atencin mdica  en el consultorio de su doctor(a), en una clnica privada, en un centro de atencin urgente o en una sala de emergencias.  Si tiene una emergencia mdica, por favor llame inmediatamente al 911 o vaya a la sala de emergencias.  Nmeros de bper  - Dr. Kowalski: [phone]  - Dra. Moye: [phone]  - Dra. Stewart: [phone]  En caso de inclemencias del tiempo, por favor llame a nuestra lnea principal al [phone] para una actualizacin sobre el estado de cualquier retraso o cierre.  Consejos para la medicacin en dermatologa: Por favor, guarde las cajas en las que vienen los medicamentos de uso tpico para ayudarle a seguir las instrucciones sobre dnde y cmo usarlos. Las farmacias generalmente imprimen las instrucciones del medicamento slo en las cajas y no directamente en los tubos del medicamento.   Si su medicamento es muy caro, por favor, pngase en contacto con   nuestra oficina llamando al [phone] y presione la opcin 4 o envenos un mensaje a travs de MyChart.   No podemos decirle cul ser su copago por los medicamentos por adelantado ya que esto es diferente dependiendo de la cobertura de su seguro. Sin embargo, es posible que podamos encontrar un medicamento sustituto a menor costo o llenar un formulario para que el seguro cubra el medicamento que se considera necesario.   Si se requiere una autorizacin previa para que su compaa de seguros cubra su medicamento, por favor permtanos de 1 a 2 das hbiles para completar este proceso.  Los precios de los medicamentos varan con frecuencia dependiendo del lugar de dnde se surte la receta y alguna farmacias pueden ofrecer precios ms baratos.  El sitio web www.goodrx.com tiene cupones para medicamentos de diferentes farmacias. Los precios aqu no tienen en cuenta lo que podra costar con la ayuda del seguro (puede ser ms barato con su seguro), pero el sitio web puede darle el  precio si no utiliz ningn seguro.  - Puede imprimir el cupn correspondiente y llevarlo con su receta a la farmacia.  - Tambin puede pasar por nuestra oficina durante el horario de atencin regular y recoger una tarjeta de cupones de GoodRx.  - Si necesita que su receta se enve electrnicamente a una farmacia diferente, informe a nuestra oficina a travs de MyChart de [place] o por telfono llamando al [phone] y presione la opcin 4.  

## 2021-12-07 ENCOUNTER — Telehealth: Payer: Self-pay

## 2021-12-07 NOTE — Telephone Encounter (Signed)
Advised pt of bx result/sh ?

## 2021-12-07 NOTE — Telephone Encounter (Signed)
-----   Message from Ralene Bathe, MD sent at 12/07/2021 12:45 PM EST ----- Diagnosis Skin , left chest parasternal BASAL CELL CARCINOMA, NODULAR PATTERN, ULCERATED  Cancer - BCC Already treated Recheck at next visit

## 2021-12-08 ENCOUNTER — Encounter: Payer: Self-pay | Admitting: Dermatology

## 2022-01-16 ENCOUNTER — Ambulatory Visit: Payer: Medicare Other | Admitting: Cardiovascular Disease

## 2022-01-19 DIAGNOSIS — M545 Low back pain, unspecified: Secondary | ICD-10-CM | POA: Insufficient documentation

## 2022-01-23 NOTE — Progress Notes (Unsigned)
Cardiology Office Note  Date:  01/24/2022   ID:  Sabri, Teal [birth year], MRN [id number]  PCP:  [name], MD   Chief Complaint  Patient presents with   12 month follow up     "Doing well." Medications reviewed by the patient verbally.     HPI:  Mr. Stegman is a very pleasant 77 year-old gentleman with a history of hypertension,  hyperlipidemia,  strong family history of coronary artery disease  Prior cardiac calcium score in 2009 of  Zero  s/p hemorrhoidectomy who presents  for routine followup of his hypertension and hyperlipidemia.   LOV November 2022 Prior episodes of chest pain Cardiac CTA ordered December 2022 Calcium score 77, minimal coronary disease noted  Recovered from neck surgery 1/23  In general has been doing well, active, plays golf, continues to consistently use his treadmill for short periods of time every morning Was taking irbesartan every other day Recorded a low pressure in the Am, 90 systolic Was taking his medication on empty stomach Reports he is taking irbesartan 150 mg daily every morning before food  EKG personally reviewed by myself on todays visit Sinus bradycardia rate 57 bpm no significant ST-T wave changes  Lab work reviewed Total cholesterol 161 LDL 73   PMH:   has a past medical history of Allergy, Arthritis (2022), Basal cell carcinoma (01/05/2013), Basal cell carcinoma (03/18/2013), Basal cell carcinoma (03/22/2016), Basal cell carcinoma (03/22/2016), Basal cell carcinoma (03/28/2017), Basal cell carcinoma (06/03/2017), Basal cell carcinoma (06/11/2018), Basal cell carcinoma (06/11/2018), Basal cell carcinoma (06/11/2018), Basal cell carcinoma (12/10/2018), Basal cell carcinoma (05/25/2020), Basal cell carcinoma (11/29/2021), Bone spur, Bulging disc (2013), Cancer ([place]), 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  PSH:    Past Surgical History:  Procedure Laterality Date   ANTERIOR CERVICAL DECOMP/DISCECTOMY FUSION N/A 02/17/2021   Procedure: ANTERIOR CERVICAL DECOMPRESSION/DISCECTOMY FUSION CERVICAL THREE-FOUR, CERVICAL FOUR-FIVE, CERVICAL FIVE-SIX;  Surgeon: [name], MD;  Location: [place] [facility];  Service: Neurosurgery;  Laterality: N/A;   BASAL CELL CARCINOMA EXCISION  2013   right leg,FOREHEAD   COLONOSCOPY  2016   COLONOSCOPY     GREEN LIGHT LASER TURP (TRANSURETHRAL RESECTION OF PROSTATE N/A 09/21/2014   Procedure: GREEN LIGHT LASER TURP (TRANSURETHRAL RESECTION OF PROSTATE;  Surgeon: [name], MD;  Location: [facility];  Service: Urology;  Laterality: N/A;   HEMORRHOID SURGERY N/A 02/28/2016   Procedure: HEMORRHOIDECTOMY;  Surgeon: [name], MD;  Location: [facility];  Service: General;  Laterality: N/A;   TONSILLECTOMY      Current Outpatient Medications  Medication Sig Dispense Refill   esomeprazole (NEXIUM) 40 MG capsule Take 40 mg by mouth daily before breakfast.     HYDROcodone-acetaminophen (NORCO) 10-325 MG tablet Take 1 tablet by mouth every 4 (four) hours as needed for severe pain ((score 7 to 10)). 30 tablet 0   irbesartan (AVAPRO) 150 MG tablet Take 150 mg by mouth daily.     Multiple Vitamins-Minerals (MULTIVITAMIN WITH MINERALS) tablet Take 1 tablet by mouth daily.     rosuvastatin (CRESTOR) 10 MG tablet Take 10 mg by mouth every other day.      terazosin (HYTRIN) 10 MG capsule Take 1 capsule (10 mg total) by mouth at bedtime.     triamcinolone cream (KENALOG) 0.1 % Apply 1 application topically 2 (two) times daily as needed. 454 g 2  hydrocortisone (ANUSOL-HC) 2.5 % rectal cream Place 1 application rectally 2 (two) times daily. As needed. (Patient not taking: Reported on 01/24/2022) 30 g 3   LYCOPENE PO Take 1 tablet by mouth daily. (Patient not taking: Reported on 01/24/2022)      sildenafil (VIAGRA) 100 MG tablet Take 100 mg by mouth as needed. (Patient not taking: Reported on 01/24/2022)     valACYclovir (VALTREX) 1000 MG tablet Take 1 tablet (1,000 mg total) by mouth as directed. Take 2 PO at onset and 2 PO 12 hours later (Patient not taking: Reported on 01/24/2022) 30 tablet 6   zolpidem (AMBIEN) 5 MG tablet Take 5 mg by mouth at bedtime as needed. (Patient not taking: Reported on 01/24/2022)     No current facility-administered medications for this visit.     Allergies:   Patient has no known allergies.   Social History:  The patient  reports that he has never smoked. He has never used smokeless tobacco. He reports current alcohol use of about 1.0 standard drink of alcohol per week. He reports that he does not use drugs.   Family History:   family history includes Cancer in his father; Coronary artery disease in his maternal uncle; Ovarian cancer in his mother.    Review of Systems: Review of Systems  Constitutional: Negative.   HENT: Negative.    Respiratory: Negative.    Cardiovascular:  Positive for chest pain.  Gastrointestinal: Negative.   Musculoskeletal: Negative.   Neurological: Negative.   Psychiatric/Behavioral: Negative.    All other systems reviewed and are negative.   PHYSICAL EXAM: VS:  BP 130/70 (BP Location: Left Arm, Patient Position: Sitting, Cuff Size: Normal)   Pulse (!) 57   Ht '5\' 7"'$  (1.702 m)   Wt 167 lb (75.8 kg)   SpO2 98%   BMI 26.16 kg/m  , BMI Body mass index is 26.16 kg/m. Constitutional:  oriented to person, place, and time. No distress.  HENT:  Head: Grossly normal Eyes:  no discharge. No scleral icterus.  Neck: No JVD, no carotid bruits  Cardiovascular: Regular rate and rhythm, no murmurs appreciated Pulmonary/Chest: Clear to auscultation bilaterally, no wheezes or rails Abdominal: Soft.  no distension.  no tenderness.  Musculoskeletal: Normal range of motion Neurological:  normal muscle tone. Coordination normal. No  atrophy Skin: Skin warm and dry Psychiatric: normal affect, pleasant   Recent Labs: 02/15/2021: BUN 12; Creatinine, Ser 1.02; Hemoglobin 13.2; Platelets 170; Potassium 4.7; Sodium 136    Lipid Panel No results found for: "CHOL", "HDL", "LDLCALC", "TRIG"    Wt Readings from Last 3 Encounters:  01/24/22 167 lb (75.8 kg)  02/15/21 169 lb 3.2 oz (76.7 kg)  12/05/20 170 lb (77.1 kg)      ASSESSMENT AND PLAN:  GERD On Nexium, Has small hiatal hernia Prior EGD, denies significant breakthrough GERD symptoms  Chest pain/angina No significant chest pain on exertion, on treadmill  cardiac CTA with no significant stenosis noted  HYPERTENSION, BENIGN - Recommended continue irbesartan 150 mg with breakfast, would prefer he try not to take on an empty stomach Stay hydrated Off amlodipine  Mixed hyperlipidemia  On Crestor every other day, cholesterol reasonable  Melanoma Followed at [facility], underwent chemotherapy lymph node positive Has periodic CT scans  Diarrhea Stable   Total encounter time more than 30  minutes  Greater than 50% was spent in counseling and coordination of care with the patient    Orders Placed This Encounter  Procedures   EKG 12-Lead  Signed, Esmond Plants, M.D., Ph.D. 01/24/2022  [place], [place]

## 2022-01-24 ENCOUNTER — Encounter: Payer: Self-pay | Admitting: Cardiovascular Disease

## 2022-01-24 ENCOUNTER — Ambulatory Visit: Payer: Medicare Other | Attending: Cardiovascular Disease | Admitting: Cardiovascular Disease

## 2022-01-24 VITALS — BP 130/70 | HR 57 | Ht 67.0 in | Wt 167.0 lb

## 2022-01-24 DIAGNOSIS — R0602 Shortness of breath: Secondary | ICD-10-CM

## 2022-01-24 DIAGNOSIS — I2510 Atherosclerotic heart disease of native coronary artery without angina pectoris: Secondary | ICD-10-CM

## 2022-01-24 DIAGNOSIS — E782 Mixed hyperlipidemia: Secondary | ICD-10-CM | POA: Diagnosis not present

## 2022-01-24 DIAGNOSIS — I10 Essential (primary) hypertension: Secondary | ICD-10-CM

## 2022-01-24 DIAGNOSIS — R079 Chest pain, unspecified: Secondary | ICD-10-CM | POA: Diagnosis not present

## 2022-01-24 DIAGNOSIS — I2584 Coronary atherosclerosis due to calcified coronary lesion: Secondary | ICD-10-CM | POA: Insufficient documentation

## 2022-01-24 NOTE — Patient Instructions (Signed)
Medication Instructions:  No changes  If you need a refill on your cardiac medications before your next appointment, please call your pharmacy.   Lab work: No new labs needed  Testing/Procedures: No new testing needed  Follow-Up: At [facility], you and your health needs are our priority.  As part of our continuing mission to provide you with exceptional heart care, we have created designated Provider Care Teams.  These Care Teams include your primary Cardiologist (physician) and Advanced Practice Providers (APPs -  Physician Assistants and Nurse Practitioners) who all work together to provide you with the care you need, when you need it.  You will need a follow up appointment in 12 months  Providers on your designated Care Team:   Christopher Berge, NP Ryan Dunn, PA-C Cadence Furth, PA-C  COVID-19 Vaccine Information can be found at: [email or web address] For questions related to vaccine distribution or appointments, please email [email or web address] or call [phone].   

## 2022-01-31 ENCOUNTER — Encounter: Payer: Self-pay | Admitting: Dermatology

## 2022-01-31 ENCOUNTER — Ambulatory Visit (INDEPENDENT_AMBULATORY_CARE_PROVIDER_SITE_OTHER): Payer: Medicare Other | Admitting: Dermatology

## 2022-01-31 VITALS — BP 122/80

## 2022-01-31 DIAGNOSIS — I2510 Atherosclerotic heart disease of native coronary artery without angina pectoris: Secondary | ICD-10-CM

## 2022-01-31 DIAGNOSIS — Z86018 Personal history of other benign neoplasm: Secondary | ICD-10-CM

## 2022-01-31 DIAGNOSIS — I2584 Coronary atherosclerosis due to calcified coronary lesion: Secondary | ICD-10-CM

## 2022-01-31 DIAGNOSIS — L820 Inflamed seborrheic keratosis: Secondary | ICD-10-CM

## 2022-01-31 DIAGNOSIS — D229 Melanocytic nevi, unspecified: Secondary | ICD-10-CM

## 2022-01-31 DIAGNOSIS — Z85828 Personal history of other malignant neoplasm of skin: Secondary | ICD-10-CM

## 2022-01-31 DIAGNOSIS — Z85820 Personal history of malignant melanoma of skin: Secondary | ICD-10-CM | POA: Diagnosis not present

## 2022-01-31 DIAGNOSIS — L814 Other melanin hyperpigmentation: Secondary | ICD-10-CM

## 2022-01-31 DIAGNOSIS — Z1283 Encounter for screening for malignant neoplasm of skin: Secondary | ICD-10-CM

## 2022-01-31 DIAGNOSIS — Z8589 Personal history of malignant neoplasm of other organs and systems: Secondary | ICD-10-CM

## 2022-01-31 DIAGNOSIS — L578 Other skin changes due to chronic exposure to nonionizing radiation: Secondary | ICD-10-CM

## 2022-01-31 DIAGNOSIS — L821 Other seborrheic keratosis: Secondary | ICD-10-CM

## 2022-01-31 NOTE — Progress Notes (Signed)
Follow-Up Visit   Subjective  Martin Lopez is a 77 y.o. male who presents for the following: Follow-up (History of Melanoma, BCC, SCC and dysplastic nevus - The patient presents for Total-Body Skin Exam (TBSE) for skin cancer screening and mole check.  The patient has spots, moles and lesions to be evaluated, some may be new or changing and the patient has concerns that these could be cancer./).  The following portions of the chart were reviewed this encounter and updated as appropriate:   Tobacco  Allergies  Meds  Problems  Med Hx  Surg Hx  Fam Hx     Review of Systems:  No other skin or systemic complaints except as noted in HPI or Assessment and Plan.  Objective  Well appearing patient in no apparent distress; mood and affect are within normal limits.  A full examination was performed including scalp, head, eyes, ears, nose, lips, neck, chest, axillae, abdomen, back, buttocks, bilateral upper extremities, bilateral lower extremities, hands, feet, fingers, toes, fingernails, and toenails. All findings within normal limits unless otherwise noted below.  Bilateral axilla, face (23) Erythematous stuck-on, waxy papule or plaque   Assessment & Plan   Lentigines - Scattered tan macules - Due to sun exposure - Benign-appearing, observe - Recommend daily broad spectrum sunscreen SPF 30+ to sun-exposed areas, reapply every 2 hours as needed. - Call for any changes  Seborrheic Keratoses - Stuck-on, waxy, tan-brown papules and/or plaques  - Benign-appearing - Discussed benign etiology and prognosis. - Observe - Call for any changes  Melanocytic Nevi - Tan-brown and/or pink-flesh-colored symmetric macules and papules - Benign appearing on exam today - Observation - Call clinic for new or changing moles - Recommend daily use of broad spectrum spf 30+ sunscreen to sun-exposed areas.   Hemangiomas - Red papules - Discussed benign nature - Observe - Call for any  changes  Actinic Damage - Chronic condition, secondary to cumulative UV/sun exposure - diffuse scaly erythematous macules with underlying dyspigmentation - Recommend daily broad spectrum sunscreen SPF 30+ to sun-exposed areas, reapply every 2 hours as needed.  - Staying in the shade or wearing long sleeves, sun glasses (UVA+UVB protection) and wide brim hats (4-inch brim around the entire circumference of the hat) are also recommended for sun protection.  - Call for new or changing lesions.  Skin cancer screening performed today.\  Inflamed seborrheic keratosis (23) Bilateral axilla, face Symptomatic, irritating, patient would like treated. Destruction of lesion - Bilateral axilla, face Complexity: simple   Destruction method: cryotherapy   Informed consent: discussed and consent obtained   Timeout:  patient name, date of birth, surgical site, and procedure verified Lesion destroyed using liquid nitrogen: Yes   Region frozen until ice ball extended beyond lesion: Yes   Outcome: patient tolerated procedure well with no complications   Post-procedure details: wound care instructions given    History of Melanoma -metastatic to the lymph node, status post surgery and chemotherapy.  Currently doing well and off treatment. - No evidence of recurrence today - No lymphadenopathy - Recommend regular full body skin exams - Recommend daily broad spectrum sunscreen SPF 30+ to sun-exposed areas, reapply every 2 hours as needed.  - Call if any new or changing lesions are noted between office visits  History of Dysplastic Nevi - No evidence of recurrence today - Recommend regular full body skin exams - Recommend daily broad spectrum sunscreen SPF 30+ to sun-exposed areas, reapply every 2 hours as needed.  - Call if  any new or changing lesions are noted between office visits  History of Basal Cell Carcinoma of the Skin - No evidence of recurrence today - Recommend regular full body skin  exams - Recommend daily broad spectrum sunscreen SPF 30+ to sun-exposed areas, reapply every 2 hours as needed.  - Call if any new or changing lesions are noted between office visits  History of Squamous Cell Carcinoma of the Skin - No evidence of recurrence today - No lymphadenopathy - Recommend regular full body skin exams - Recommend daily broad spectrum sunscreen SPF 30+ to sun-exposed areas, reapply every 2 hours as needed.  - Call if any new or changing lesions are noted between office visits  Return in about 6 months (around 08/01/2022) for TBSE History of Melanoma.  I, Ashok Cordia, CMA, am acting as scribe for [name], MD . Documentation: I have reviewed the above documentation for accuracy and completeness, and I agree with the above.  [name], MD

## 2022-01-31 NOTE — Patient Instructions (Addendum)
Cryotherapy Aftercare  Wash gently with soap and water everyday.   Apply Vaseline and Band-Aid daily until healed.     Due to recent changes in healthcare laws, you may see results of your pathology and/or laboratory studies on MyChart before the doctors have had a chance to review them. We understand that in some cases there may be results that are confusing or concerning to you. Please understand that not all results are received at the same time and often the doctors may need to interpret multiple results in order to provide you with the best plan of care or course of treatment. Therefore, we ask that you please give us 2 business days to thoroughly review all your results before contacting the office for clarification. Should we see a critical lab result, you will be contacted sooner.   If You Need Anything After Your Visit  If you have any questions or concerns for your doctor, please call our main line at [phone] and press option 4 to reach your doctor's medical assistant. If no one answers, please leave a voicemail as directed and we will return your call as soon as possible. Messages left after 4 pm will be answered the following business day.   You may also send us a message via MyChart. We typically respond to MyChart messages within 1-2 business days.  For prescription refills, please ask your pharmacy to contact our office. Our fax number is [phone].  If you have an urgent issue when the clinic is closed that cannot wait until the next business day, you can page your doctor at the number below.    Please note that while we do our best to be available for urgent issues outside of office hours, we are not available 24/7.   If you have an urgent issue and are unable to reach us, you may choose to seek medical care at your doctor's office, retail clinic, urgent care center, or emergency room.  If you have a medical emergency, please immediately call 911 or go to the  emergency department.  Pager Numbers  - Dr. Kowalski: [phone]  - Dr. Moye: [phone]  - Dr. Stewart: [phone]  In the event of inclement weather, please call our main line at [phone] for an update on the status of any delays or closures.  Dermatology Medication Tips: Please keep the boxes that topical medications come in in order to help keep track of the instructions about where and how to use these. Pharmacies typically print the medication instructions only on the boxes and not directly on the medication tubes.   If your medication is too expensive, please contact our office at [phone] option 4 or send us a message through MyChart.   We are unable to tell what your co-pay for medications will be in advance as this is different depending on your insurance coverage. However, we may be able to find a substitute medication at lower cost or fill out paperwork to get insurance to cover a needed medication.   If a prior authorization is required to get your medication covered by your insurance company, please allow us 1-2 business days to complete this process.  Drug prices often vary depending on where the prescription is filled and some pharmacies may offer cheaper prices.  The website www.goodrx.com contains coupons for medications through different pharmacies. The prices here do not account for what the cost may be with help from insurance (it may be cheaper with your insurance), but the website can   give you the price if you did not use any insurance.  - You can print the associated coupon and take it with your prescription to the pharmacy.  - You may also stop by our office during regular business hours and pick up a GoodRx coupon card.  - If you need your prescription sent electronically to a different pharmacy, notify our office through [facility] MyChart or by phone at [phone] option 4.     Si Usted Necesita Algo Despus de Su Visita  Tambin puede  enviarnos un mensaje a travs de MyChart. Por lo general respondemos a los mensajes de MyChart en el transcurso de 1 a 2 das hbiles.  Para renovar recetas, por favor pida a su farmacia que se ponga en contacto con nuestra oficina. Nuestro nmero de fax es el [phone].  Si tiene un asunto urgente cuando la clnica est cerrada y que no puede esperar hasta el siguiente da hbil, puede llamar/localizar a su doctor(a) al nmero que aparece a continuacin.   Por favor, tenga en cuenta que aunque hacemos todo lo posible para estar disponibles para asuntos urgentes fuera del horario de oficina, no estamos disponibles las 24 horas del da, los 7 das de la semana.   Si tiene un problema urgente y no puede comunicarse con nosotros, puede optar por buscar atencin mdica  en el consultorio de su doctor(a), en una clnica privada, en un centro de atencin urgente o en una sala de emergencias.  Si tiene una emergencia mdica, por favor llame inmediatamente al 911 o vaya a la sala de emergencias.  Nmeros de bper  - Dr. Kowalski: [phone]  - Dra. Moye: [phone]  - Dra. Stewart: [phone]  En caso de inclemencias del tiempo, por favor llame a nuestra lnea principal al [phone] para una actualizacin sobre el estado de cualquier retraso o cierre.  Consejos para la medicacin en dermatologa: Por favor, guarde las cajas en las que vienen los medicamentos de uso tpico para ayudarle a seguir las instrucciones sobre dnde y cmo usarlos. Las farmacias generalmente imprimen las instrucciones del medicamento slo en las cajas y no directamente en los tubos del medicamento.   Si su medicamento es muy caro, por favor, pngase en contacto con nuestra oficina llamando al [phone] y presione la opcin 4 o envenos un mensaje a travs de MyChart.   No podemos decirle cul ser su copago por los medicamentos por adelantado ya que esto es diferente dependiendo de la cobertura de su seguro.  Sin embargo, es posible que podamos encontrar un medicamento sustituto a menor costo o llenar un formulario para que el seguro cubra el medicamento que se considera necesario.   Si se requiere una autorizacin previa para que su compaa de seguros cubra su medicamento, por favor permtanos de 1 a 2 das hbiles para completar este proceso.  Los precios de los medicamentos varan con frecuencia dependiendo del lugar de dnde se surte la receta y alguna farmacias pueden ofrecer precios ms baratos.  El sitio web www.goodrx.com tiene cupones para medicamentos de diferentes farmacias. Los precios aqu no tienen en cuenta lo que podra costar con la ayuda del seguro (puede ser ms barato con su seguro), pero el sitio web puede darle el precio si no utiliz ningn seguro.  - Puede imprimir el cupn correspondiente y llevarlo con su receta a la farmacia.  - Tambin puede pasar por nuestra oficina durante el horario de atencin regular y recoger una tarjeta de cupones de GoodRx.  -   Si necesita que su receta se enve electrnicamente a una farmacia diferente, informe a nuestra oficina a travs de MyChart de [facility] o por telfono llamando al [phone] y presione la opcin 4.  

## 2022-04-24 IMAGING — CT CT HEART MORP W/ CTA COR W/ SCORE W/ CA W/CM &/OR W/O CM
1 of 14 series · 3 of 20 positions shown, 4 images · non-contrast
Comparison: None.

Addendum:
EXAM:
Cardiac/Coronary  CTA
TECHNIQUE: The patient was scanned on a Siemens Somatom go.Top scanner.

[Series 25: multiphase % cta coronary 0.60 · axial · 0.38mm/px · z∈[-1135,-1075]mm · 3 of 3311 slices shown, 4 images]
[im 828/3311  vessel]
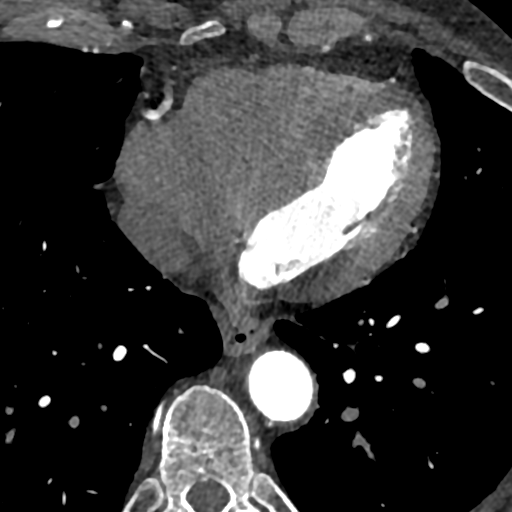
[im 828/3311  lung]
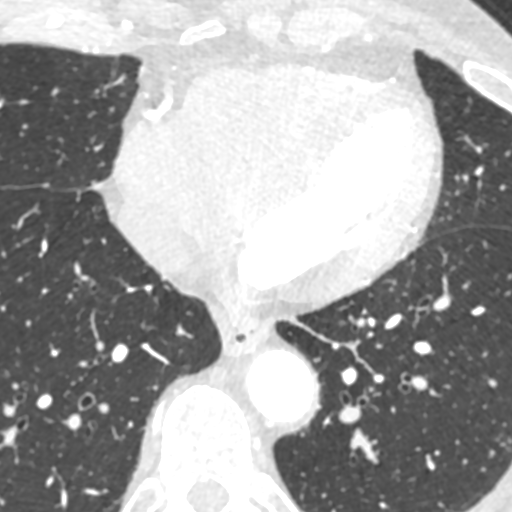
[im 1656/3311  vessel]
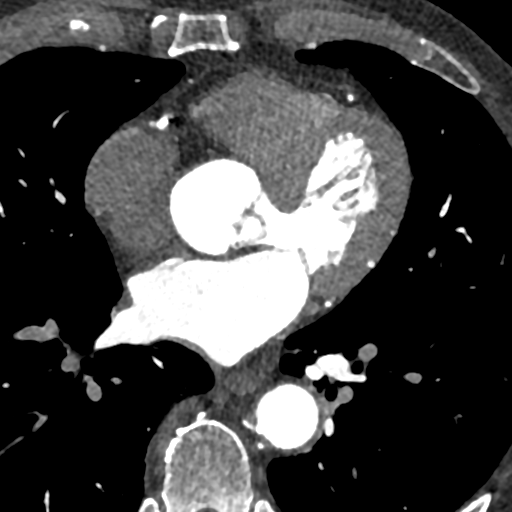
[im 2483/3311  vessel]
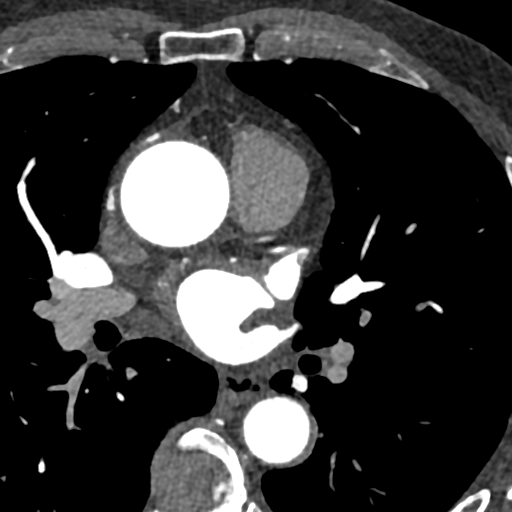

[3 of 20 positions shown; findings below may reference images not displayed]



Aortic Valve:  Trileaflet.  No calcifications.

Coronary Arteries:  Normal coronary origin.  Right dominance.

RCA is a dominant artery that gives rise to PDA and PLA. Mild
proximal calcified plaque causing minimal stenosis (<25%).

Left main is a large artery that gives rise to LAD and LCX arteries.
There is no LM disease

LAD has minimal calcifications proximally causing minimal stenosis
(<25%).

LCX is a non-dominant artery that gives rise to three obtuse
marginal branches. There is no plaque.

Other findings:

Normal pulmonary vein drainage into the left atrium.

Normal left atrial appendage without a thrombus.

Normal size of the pulmonary artery.
IMPRESSION: 1. Coronary calcium score of 77.2. This was 31st percentile for age
and sex matched control.

2. Normal coronary origin with right dominance.

3. Calcified plaque causing minimal stenosis in the LAD and RCA.

4. CAD-RADS 1. Minimal non-obstructive CAD (0-24%). Consider
non-atherosclerotic causes of chest pain.

5. Consider preventive therapy and risk factor modification.

EXAM:
OVER-READ INTERPRETATION  CT CHEST

The following report is an over-read performed by radiologist Dr.
over-read does not include interpretation of cardiac or coronary
anatomy or pathology. The coronary calcium score and cardiac CTA
interpretation by the cardiologist is attached.
FINDINGS: Aortic atherosclerosis. Small hiatal hernia. Within the visualized
portions of the thorax there are no suspicious appearing pulmonary
nodules or masses, there is no acute consolidative airspace disease,
no pleural effusions, no pneumothorax and no lymphadenopathy.
Visualized portions of the upper abdomen are unremarkable. There are
no aggressive appearing lytic or blastic lesions noted in the
visualized portions of the skeleton.
IMPRESSION: 1.  Aortic Atherosclerosis ([id number]).

*** End of Addendum ***
EXAM:
Cardiac/Coronary  CTA
:
A retrospective scan was triggered in the descending thoracic aorta.
Axial non-contrast 3 mm slices were carried out through the heart.
The data set was analyzed on a dedicated work station and scored
using the Agatson method. Gantry rotation speed was 330 msecs and
collimation was .6 mm. 100mg of metoprolol and 0.8 mg of sl NTG was
given. The 3D data set was reconstructed in 5% intervals of the
60-95 % of the R-R cycle. Diastolic phases were analyzed on a
dedicated work station using MPR, MIP and VRT modes. The patient
received 75 cc of contrast.
FINDINGS: Aorta:  Normal size.  No calcifications.  No dissection.

Aortic Valve:  Trileaflet.  No calcifications.

Coronary Arteries:  Normal coronary origin.  Right dominance.

RCA is a dominant artery that gives rise to PDA and PLA. Mild
proximal calcified plaque causing minimal stenosis (<25%).

Left main is a large artery that gives rise to LAD and LCX arteries.
There is no LM disease

LAD has minimal calcifications proximally causing minimal stenosis
(<25%).

LCX is a non-dominant artery that gives rise to three obtuse
marginal branches. There is no plaque.

Other findings:

Normal pulmonary vein drainage into the left atrium.

Normal left atrial appendage without a thrombus.

Normal size of the pulmonary artery.
IMPRESSION: 1. Coronary calcium score of 77.2. This was 31st percentile for age
and sex matched control.

2. Normal coronary origin with right dominance.

3. Calcified plaque causing minimal stenosis in the LAD and RCA.

4. CAD-RADS 1. Minimal non-obstructive CAD (0-24%). Consider
non-atherosclerotic causes of chest pain.

5. Consider preventive therapy and risk factor modification.

## 2022-06-20 IMAGING — RF DG CERVICAL SPINE 1V
1 series · 1 of 1 positions shown · non-contrast
Comparison: None.

CLINICAL DATA: C3 through C6 ACDF

EXAM:
DG CERVICAL SPINE - 1 VIEW

[Series 1: run · 1 of 1 slices shown]
[im 1/1]
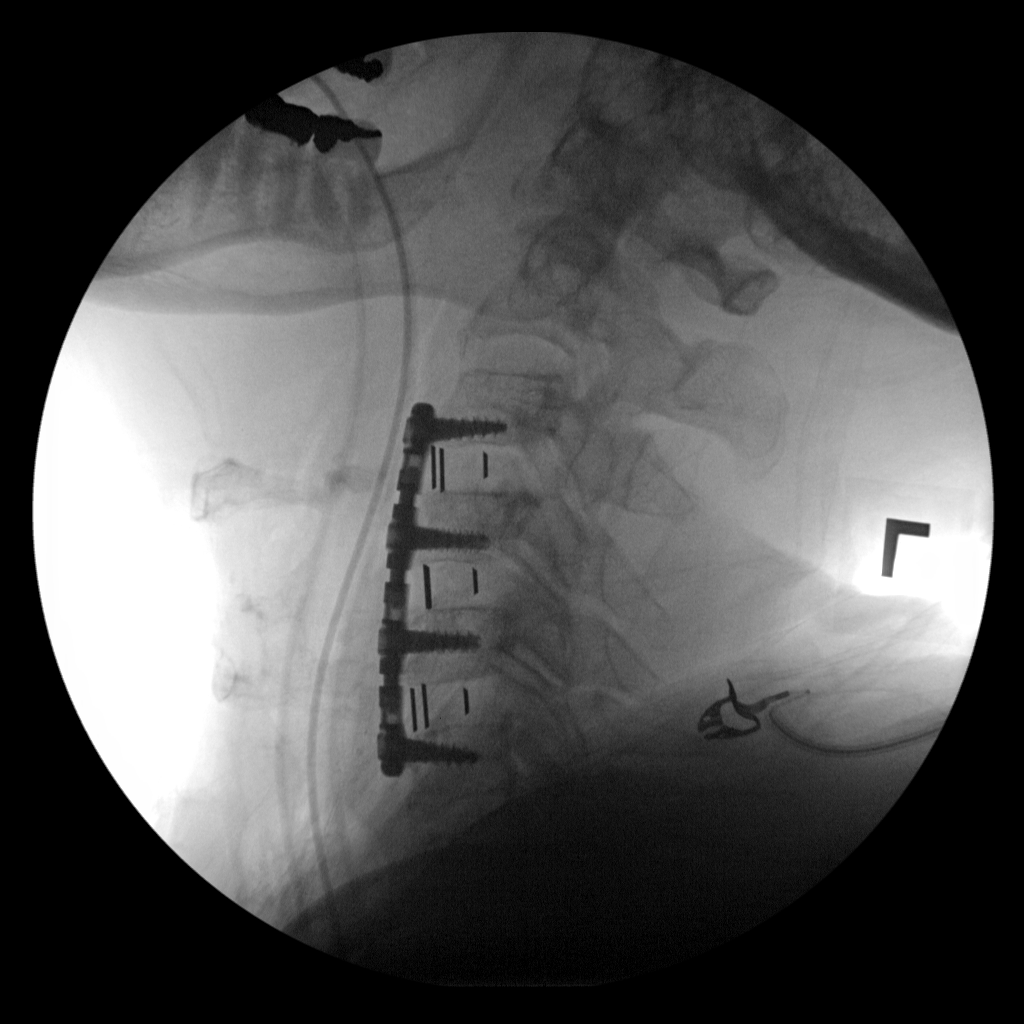

[1 of 1 positions shown; findings below may reference images not displayed]

FINDINGS: Images were performed intraoperatively without the presence of a
radiologist. The patient is intubated. C3 through C6 ACDF hardware
with intervertebral disc spacers is seen.

Please see intraoperative findings for further detail.
IMPRESSION: Fluoroscopy for ACDF.

## 2022-07-19 ENCOUNTER — Ambulatory Visit (INDEPENDENT_AMBULATORY_CARE_PROVIDER_SITE_OTHER): Payer: Medicare Other | Admitting: Dermatology

## 2022-07-19 ENCOUNTER — Encounter: Payer: Self-pay | Admitting: Dermatology

## 2022-07-19 VITALS — BP 139/80 | HR 69

## 2022-07-19 DIAGNOSIS — B078 Other viral warts: Secondary | ICD-10-CM

## 2022-07-19 DIAGNOSIS — C439 Malignant melanoma of skin, unspecified: Secondary | ICD-10-CM

## 2022-07-19 DIAGNOSIS — L814 Other melanin hyperpigmentation: Secondary | ICD-10-CM

## 2022-07-19 DIAGNOSIS — L820 Inflamed seborrheic keratosis: Secondary | ICD-10-CM

## 2022-07-19 DIAGNOSIS — W908XXA Exposure to other nonionizing radiation, initial encounter: Secondary | ICD-10-CM

## 2022-07-19 DIAGNOSIS — L578 Other skin changes due to chronic exposure to nonionizing radiation: Secondary | ICD-10-CM

## 2022-07-19 DIAGNOSIS — L821 Other seborrheic keratosis: Secondary | ICD-10-CM

## 2022-07-19 DIAGNOSIS — Z7189 Other specified counseling: Secondary | ICD-10-CM

## 2022-07-19 DIAGNOSIS — Z85820 Personal history of malignant melanoma of skin: Secondary | ICD-10-CM

## 2022-07-19 DIAGNOSIS — Z85828 Personal history of other malignant neoplasm of skin: Secondary | ICD-10-CM

## 2022-07-19 DIAGNOSIS — Z1283 Encounter for screening for malignant neoplasm of skin: Secondary | ICD-10-CM

## 2022-07-19 DIAGNOSIS — Z8589 Personal history of malignant neoplasm of other organs and systems: Secondary | ICD-10-CM

## 2022-07-19 DIAGNOSIS — D1801 Hemangioma of skin and subcutaneous tissue: Secondary | ICD-10-CM

## 2022-07-19 DIAGNOSIS — L570 Actinic keratosis: Secondary | ICD-10-CM | POA: Diagnosis not present

## 2022-07-19 DIAGNOSIS — D229 Melanocytic nevi, unspecified: Secondary | ICD-10-CM

## 2022-07-19 DIAGNOSIS — Z86018 Personal history of other benign neoplasm: Secondary | ICD-10-CM

## 2022-07-19 NOTE — Patient Instructions (Signed)
Cryotherapy Aftercare  Wash gently with soap and water everyday.   Apply Vaseline and Band-Aid daily until healed.   Recommend daily broad spectrum sunscreen SPF 30+ to sun-exposed areas, reapply every 2 hours as needed. Call for new or changing lesions.  Staying in the shade or wearing long sleeves, sun glasses (UVA+UVB protection) and wide brim hats (4-inch brim around the entire circumference of the hat) are also recommended for sun protection.    Melanoma ABCDEs  Melanoma is the most dangerous type of skin cancer, and is the leading cause of death from skin disease.  You are more likely to develop melanoma if you: Have light-colored skin, light-colored eyes, or red or blond hair Spend a lot of time in the sun Tan regularly, either outdoors or in a tanning bed Have had blistering sunburns, especially during childhood Have a close family member who has had a melanoma Have atypical moles or large birthmarks  Early detection of melanoma is key since treatment is typically straightforward and cure rates are extremely high if we catch it early.   The first sign of melanoma is often a change in a mole or a new dark spot.  The ABCDE system is a way of remembering the signs of melanoma.  A for asymmetry:  The two halves do not match. B for border:  The edges of the growth are irregular. C for color:  A mixture of colors are present instead of an even brown color. D for diameter:  Melanomas are usually (but not always) greater than 6mm - the size of a pencil eraser. E for evolution:  The spot keeps changing in size, shape, and color.  Please check your skin once per month between visits. You can use a small mirror in front and a large mirror behind you to keep an eye on the back side or your body.   If you see any new or changing lesions before your next follow-up, please call to schedule a visit.  Please continue daily skin protection including broad spectrum sunscreen SPF 30+ to  sun-exposed areas, reapplying every 2 hours as needed when you're outdoors.   Staying in the shade or wearing long sleeves, sun glasses (UVA+UVB protection) and wide brim hats (4-inch brim around the entire circumference of the hat) are also recommended for sun protection.     Due to recent changes in healthcare laws, you may see results of your pathology and/or laboratory studies on MyChart before the doctors have had a chance to review them. We understand that in some cases there may be results that are confusing or concerning to you. Please understand that not all results are received at the same time and often the doctors may need to interpret multiple results in order to provide you with the best plan of care or course of treatment. Therefore, we ask that you please give us 2 business days to thoroughly review all your results before contacting the office for clarification. Should we see a critical lab result, you will be contacted sooner.   If You Need Anything After Your Visit  If you have any questions or concerns for your doctor, please call our main line at [phone] and press option 4 to reach your doctor's medical assistant. If no one answers, please leave a voicemail as directed and we will return your call as soon as possible. Messages left after 4 pm will be answered the following business day.   You may also send us a message via   MyChart. We typically respond to MyChart messages within 1-2 business days.  For prescription refills, please ask your pharmacy to contact our office. Our fax number is [phone].  If you have an urgent issue when the clinic is closed that cannot wait until the next business day, you can page your doctor at the number below.    Please note that while we do our best to be available for urgent issues outside of office hours, we are not available 24/7.   If you have an urgent issue and are unable to reach us, you may choose to seek medical care at your  doctor's office, retail clinic, urgent care center, or emergency room.  If you have a medical emergency, please immediately call 911 or go to the emergency department.  Pager Numbers  - Dr. Kowalski: [phone]  - Dr. Moye: [phone]  - Dr. Stewart: [phone]  In the event of inclement weather, please call our main line at [phone] for an update on the status of any delays or closures.  Dermatology Medication Tips: Please keep the boxes that topical medications come in in order to help keep track of the instructions about where and how to use these. Pharmacies typically print the medication instructions only on the boxes and not directly on the medication tubes.   If your medication is too expensive, please contact our office at [phone] option 4 or send us a message through MyChart.   We are unable to tell what your co-pay for medications will be in advance as this is different depending on your insurance coverage. However, we may be able to find a substitute medication at lower cost or fill out paperwork to get insurance to cover a needed medication.   If a prior authorization is required to get your medication covered by your insurance company, please allow us 1-2 business days to complete this process.  Drug prices often vary depending on where the prescription is filled and some pharmacies may offer cheaper prices.  The website www.goodrx.com contains coupons for medications through different pharmacies. The prices here do not account for what the cost may be with help from insurance (it may be cheaper with your insurance), but the website can give you the price if you did not use any insurance.  - You can print the associated coupon and take it with your prescription to the pharmacy.  - You may also stop by our office during regular business hours and pick up a GoodRx coupon card.  - If you need your prescription sent electronically to a different pharmacy, notify  our office through [facility] MyChart or by phone at [phone] option 4.     Si Usted Necesita Algo Despus de Su Visita  Tambin puede enviarnos un mensaje a travs de MyChart. Por lo general respondemos a los mensajes de MyChart en el transcurso de 1 a 2 das hbiles.  Para renovar recetas, por favor pida a su farmacia que se ponga en contacto con nuestra oficina. Nuestro nmero de fax es el [phone].  Si tiene un asunto urgente cuando la clnica est cerrada y que no puede esperar hasta el siguiente da hbil, puede llamar/localizar a su doctor(a) al nmero que aparece a continuacin.   Por favor, tenga en cuenta que aunque hacemos todo lo posible para estar disponibles para asuntos urgentes fuera del horario de oficina, no estamos disponibles las 24 horas del da, los 7 das de la semana.   Si tiene un problema urgente y no   puede comunicarse con nosotros, puede optar por buscar atencin mdica  en el consultorio de su doctor(a), en una clnica privada, en un centro de atencin urgente o en una sala de emergencias.  Si tiene una emergencia mdica, por favor llame inmediatamente al 911 o vaya a la sala de emergencias.  Nmeros de bper  - Dr. Kowalski: [phone]  - Dra. Moye: [phone]  - Dra. Stewart: [phone]  En caso de inclemencias del tiempo, por favor llame a nuestra lnea principal al [phone] para una actualizacin sobre el estado de cualquier retraso o cierre.  Consejos para la medicacin en dermatologa: Por favor, guarde las cajas en las que vienen los medicamentos de uso tpico para ayudarle a seguir las instrucciones sobre dnde y cmo usarlos. Las farmacias generalmente imprimen las instrucciones del medicamento slo en las cajas y no directamente en los tubos del medicamento.   Si su medicamento es muy caro, por favor, pngase en contacto con nuestra oficina llamando al [phone] y presione la opcin 4 o envenos un mensaje a travs de  MyChart.   No podemos decirle cul ser su copago por los medicamentos por adelantado ya que esto es diferente dependiendo de la cobertura de su seguro. Sin embargo, es posible que podamos encontrar un medicamento sustituto a menor costo o llenar un formulario para que el seguro cubra el medicamento que se considera necesario.   Si se requiere una autorizacin previa para que su compaa de seguros cubra su medicamento, por favor permtanos de 1 a 2 das hbiles para completar este proceso.  Los precios de los medicamentos varan con frecuencia dependiendo del lugar de dnde se surte la receta y alguna farmacias pueden ofrecer precios ms baratos.  El sitio web www.goodrx.com tiene cupones para medicamentos de diferentes farmacias. Los precios aqu no tienen en cuenta lo que podra costar con la ayuda del seguro (puede ser ms barato con su seguro), pero el sitio web puede darle el precio si no utiliz ningn seguro.  - Puede imprimir el cupn correspondiente y llevarlo con su receta a la farmacia.  - Tambin puede pasar por nuestra oficina durante el horario de atencin regular y recoger una tarjeta de cupones de GoodRx.  - Si necesita que su receta se enve electrnicamente a una farmacia diferente, informe a nuestra oficina a travs de MyChart de [facility] o por telfono llamando al [phone] y presione la opcin 4.  

## 2022-07-19 NOTE — Progress Notes (Signed)
Follow-Up Visit   Subjective  Martin Lopez is a 77 y.o. male who presents for the following: Skin Cancer Screening and Full Body Skin Exam. HxMM, HxBCC, HxSCC. Areas of concern.  The patient presents for Total-Body Skin Exam (TBSE) for skin cancer screening and mole check. The patient has spots, moles and lesions to be evaluated, some may be new or changing and the patient has concerns that these could be cancer.  The following portions of the chart were reviewed this encounter and updated as appropriate: medications, allergies, medical history  Review of Systems:  No other skin or systemic complaints except as noted in HPI or Assessment and Plan.  Objective  Well appearing patient in no apparent distress; mood and affect are within normal limits.  A full examination was performed including scalp, head, eyes, ears, nose, lips, neck, chest, axillae, abdomen, back, buttocks, bilateral upper extremities, bilateral lower extremities, hands, feet, fingers, toes, fingernails, and toenails. All findings within normal limits unless otherwise noted below.   Relevant physical exam findings are noted in the Assessment and Plan.  Scalp, right ear x6 (6) Erythematous thin papules/macules with gritty scale.   Left temple x1, right forearm x1, right post scalp at hair line x3 (5) Erythematous keratotic or waxy stuck-on papule or plaque.  Right Hallux Distal Dorsal Toe x1 Verrucous papule -- Discussed viral etiology and contagion.    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  Currently doing well and off treatment. Has annual follow ups. Right distal deltoid. 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  Follow-up with oncology at [facility] - No evidence of recurrence today - No lymphadenopathy - Recommend regular full body skin exams - Recommend daily broad spectrum sunscreen SPF 30+ to sun-exposed  areas, reapply every 2 hours as needed.  - Call if any new or changing lesions are noted between office visits   History of Dysplastic Nevi - No evidence of recurrence today - Recommend regular full body skin exams - Recommend daily broad spectrum sunscreen SPF 30+ to sun-exposed areas, reapply every 2 hours as needed.  - Call if any new or changing lesions are noted between office visits   History of Basal Cell Carcinoma of the Skin - No evidence of recurrence today - Recommend regular full body skin exams - Recommend daily broad spectrum sunscreen SPF 30+ to sun-exposed areas, reapply every 2 hours as needed.  - Call if any new or changing lesions are noted between office visits   History of Squamous Cell Carcinoma of the Skin - No evidence of recurrence today - No lymphadenopathy - Recommend regular full body skin exams - Recommend daily broad spectrum sunscreen SPF 30+ to sun-exposed areas, reapply every 2 hours as needed.  - Call if any new or changing lesions are noted between office visits  LENTIGINES, SEBORRHEIC KERATOSES, HEMANGIOMAS - Benign normal skin lesions - Benign-appearing - Call for any changes  MELANOCYTIC NEVI - Tan-brown and/or pink-flesh-colored symmetric macules and papules - Benign appearing on exam today - Observation - Call clinic for new or changing moles - Recommend daily use of broad spectrum spf 30+ sunscreen to sun-exposed areas.   ACTINIC DAMAGE - Chronic condition, secondary to cumulative UV/sun exposure - diffuse scaly erythematous macules with underlying dyspigmentation - Recommend daily broad spectrum sunscreen SPF 30+ to sun-exposed areas, reapply every 2 hours as needed.  - Staying in the shade or wearing long  sleeves, sun glasses (UVA+UVB protection) and wide brim hats (4-inch brim around the entire circumference of the hat) are also recommended for sun protection.  - Call for new or changing lesions.  SKIN CANCER SCREENING PERFORMED  TODAY.  AK (actinic keratosis) (6) Scalp, right ear x6  Actinic keratoses are precancerous spots that appear secondary to cumulative UV radiation exposure/sun exposure over time. They are chronic with expected duration over 1 year. A portion of actinic keratoses will progress to squamous cell carcinoma of the skin. It is not possible to reliably predict which spots will progress to skin cancer and so treatment is recommended to prevent development of skin cancer.  Recommend daily broad spectrum sunscreen SPF 30+ to sun-exposed areas, reapply every 2 hours as needed.  Recommend staying in the shade or wearing long sleeves, sun glasses (UVA+UVB protection) and wide brim hats (4-inch brim around the entire circumference of the hat). Call for new or changing lesions.  Destruction of lesion - Scalp, right ear x6 Complexity: simple   Destruction method: cryotherapy   Informed consent: discussed and consent obtained   Timeout:  patient name, date of birth, surgical site, and procedure verified Lesion destroyed using liquid nitrogen: Yes   Region frozen until ice ball extended beyond lesion: Yes   Outcome: patient tolerated procedure well with no complications   Post-procedure details: wound care instructions given   Additional details:  Prior to procedure, discussed risks of blister formation, small wound, skin dyspigmentation, or rare scar following cryotherapy. Recommend Vaseline ointment to treated areas while healing.   Inflamed seborrheic keratosis (5) Left temple x1, right forearm x1, right post scalp at hair line x3  Symptomatic, irritating, patient would like treated.  Destruction of lesion - Left temple x1, right forearm x1, right post scalp at hair line x3 Complexity: simple   Destruction method: cryotherapy   Informed consent: discussed and consent obtained   Timeout:  patient name, date of birth, surgical site, and procedure verified Lesion destroyed using liquid nitrogen: Yes    Region frozen until ice ball extended beyond lesion: Yes   Outcome: patient tolerated procedure well with no complications   Post-procedure details: wound care instructions given   Additional details:  Prior to procedure, discussed risks of blister formation, small wound, skin dyspigmentation, or rare scar following cryotherapy. Recommend Vaseline ointment to treated areas while healing.   Other viral warts Right Hallux Distal Dorsal Toe x1  Viral Wart (HPV) Counseling  Discussed viral / HPV (Human Papilloma Virus) etiology and risk of spread /infectivity to other areas of body as well as to other people.  Multiple treatments and methods may be required to clear warts and it is possible treatment may not be successful.  Treatment risks include discoloration; scarring and there is still potential for wart recurrence.  Counseling on HPV and HPV vaccine HPV = human papilloma virus is a viral infection of the skin.  Viral warts/HPV may be common and transmitted on any part of the body by direct contact.  HPV can also be sexually transmitted and produced viral warts in the genital area that are often called condyloma.  HPV/viral warts/condyloma can be difficult to treat and require multiple treatments and multiple treatment methods.  HPV infections may not be curable and have potential for persistence or recurrence despite multiple treatments.  There is a risk that especially certain types of HPV in the genital area can cause cervical cancer and other genital cancer.  Anecdotally, it seems to be helpful in  some patients to have the HPV vaccine series of injections.  In some patients this seems to trigger the immune system to overcome especially difficult HPV infections that have not responded to multiple treatments and multiple methods of treatments. It may be recommended that patients with HPV infections consider HPV vaccine to control thier condition better.  HPV vaccines are FDA approved for up to age  88 years old and comes under the brand name of Gardasil vaccine.   Destruction of lesion - Right Hallux Distal Dorsal Toe x1 Complexity: simple   Destruction method: cryotherapy   Informed consent: discussed and consent obtained   Timeout:  patient name, date of birth, surgical site, and procedure verified Lesion destroyed using liquid nitrogen: Yes   Region frozen until ice ball extended beyond lesion: Yes   Outcome: patient tolerated procedure well with no complications   Post-procedure details: wound care instructions given   Additional details:  Prior to procedure, discussed risks of blister formation, small wound, skin dyspigmentation, or rare scar following cryotherapy. Recommend Vaseline ointment to treated areas while healing.    Return in about 6 months (around 01/18/2023) for TBSE, HxMM, HxSCC, HxBCC, HxDN.  I, Lawson Radar, CMA, am acting as scribe for Armida Sans, MD.  Documentation: I have reviewed the above documentation for accuracy and completeness, and I agree with the above.  Armida Sans, MD

## 2022-07-20 ENCOUNTER — Encounter: Payer: Self-pay | Admitting: Dermatology

## 2022-09-19 ENCOUNTER — Encounter: Payer: Self-pay | Admitting: Dermatology

## 2022-09-19 ENCOUNTER — Ambulatory Visit (INDEPENDENT_AMBULATORY_CARE_PROVIDER_SITE_OTHER): Payer: Medicare Other | Admitting: Dermatology

## 2022-09-19 DIAGNOSIS — L570 Actinic keratosis: Secondary | ICD-10-CM | POA: Diagnosis not present

## 2022-09-19 DIAGNOSIS — W908XXA Exposure to other nonionizing radiation, initial encounter: Secondary | ICD-10-CM | POA: Diagnosis not present

## 2022-09-19 NOTE — Progress Notes (Signed)
   Follow-Up Visit   Subjective  Martin Lopez is a 77 y.o. male who presents for the following: Patient c/o growth on the left arm appeared about 6 weeks ago and will not go away.   The patient has spots, moles and lesions to be evaluated, some may be new or changing and the patient may have concern these could be cancer.    The following portions of the chart were reviewed this encounter and updated as appropriate: medications, allergies, medical history  Review of Systems:  No other skin or systemic complaints except as noted in HPI or Assessment and Plan.  Objective  Well appearing patient in no apparent distress; mood and affect are within normal limits.  A focused examination was performed of the following areas:face,arms   Relevant physical exam findings are noted in the Assessment and Plan.  left forearm x 1 Erythematous thin papules/macules with gritty scale.     Assessment & Plan   AK (actinic keratosis) left forearm x 1  Actinic keratoses are precancerous spots that appear secondary to cumulative UV radiation exposure/sun exposure over time. They are chronic with expected duration over 1 year. A portion of actinic keratoses will progress to squamous cell carcinoma of the skin. It is not possible to reliably predict which spots will progress to skin cancer and so treatment is recommended to prevent development of skin cancer.  Recommend daily broad spectrum sunscreen SPF 30+ to sun-exposed areas, reapply every 2 hours as needed.  Recommend staying in the shade or wearing long sleeves, sun glasses (UVA+UVB protection) and wide brim hats (4-inch brim around the entire circumference of the hat). Call for new or changing lesions.   If not gone away in 4-6 weeks return to the office for a biopsy.  Destruction of lesion - left forearm x 1 Complexity: simple   Destruction method: cryotherapy   Informed consent: discussed and consent obtained   Timeout:  patient name,  date of birth, surgical site, and procedure verified Lesion destroyed using liquid nitrogen: Yes   Region frozen until ice ball extended beyond lesion: Yes   Outcome: patient tolerated procedure well with no complications   Post-procedure details: wound care instructions given       Return if symptoms worsen or fail to improve.  IAngelique Holm, CMA, am acting as scribe for Elie Goody, MD .   Documentation: I have reviewed the above documentation for accuracy and completeness, and I agree with the above.  Elie Goody, MD

## 2022-09-19 NOTE — Patient Instructions (Addendum)

## 2022-11-26 ENCOUNTER — Encounter: Payer: Self-pay | Admitting: Cardiovascular Disease

## 2023-01-28 ENCOUNTER — Ambulatory Visit: Payer: Medicare Other | Admitting: Dermatology

## 2023-01-30 ENCOUNTER — Ambulatory Visit: Payer: Medicare Other | Admitting: Dermatology

## 2023-02-04 NOTE — Progress Notes (Signed)
 Cardiology Office Note  Date:  02/05/2023   ID:  Martin Lopez, Martin Lopez 04-02-1945, MRN [id number]  PCP:  Corlis Honor BROCKS, MD   Chief Complaint  Patient presents with   12 month follow up     Doing well.     HPI:  Martin Lopez is a very pleasant 78 year-old gentleman with a history of hypertension,  hyperlipidemia,  strong family history of coronary artery disease  Prior cardiac calcium  score in 2009 of  Zero  s/p hemorrhoidectomy Cardiac CTA  December 2022 Calcium  score 77, minimal coronary disease who presents  for routine followup of his hypertension and hyperlipidemia.   LOV with myself January 2024 In follow-up today reports he is active, denies chest pain or shortness of breath  Exercises daily 30 min On treadmill for 15 minutes, stretching for 15 minutes Has been doing this for years  Reports he is off amlodipine , off losartan, blood pressure stable  active, plays golf  EKG personally reviewed by myself on todays visit EKG Interpretation Date/Time:  Tuesday February 05 2023 09:45:51 EST Ventricular Rate:  64 PR Interval:  206 QRS Duration:  70 QT Interval:  400 QTC Calculation: 412 R Axis:   3  Text Interpretation: Normal sinus rhythm Normal ECG When compared with ECG of 22-Feb-2016 13:54, No significant change was found Confirmed by Perla Lye [phone]) on 02/05/2023 9:57:44 AM    Lab work reviewed Total cholesterol 179 LDL 75 Numbers trending higher, takes Crestor  10 mg every other day   PMH:   has a past medical history of Allergy, Arthritis (2022), Basal cell carcinoma (01/05/2013), Basal cell carcinoma (03/18/2013), Basal cell carcinoma (03/22/2016), Basal cell carcinoma (03/22/2016), Basal cell carcinoma (03/28/2017), Basal cell carcinoma (06/03/2017), Basal cell carcinoma (06/11/2018), Basal cell carcinoma (06/11/2018), Basal cell carcinoma (06/11/2018), Basal cell carcinoma (12/10/2018), Basal cell carcinoma (05/25/2020), Basal cell carcinoma  (11/29/2021), Bone spur, Bulging disc (2013), Cancer [facility]), Dysplastic nevus (01/05/2013), GERD (gastroesophageal reflux disease), HLD (hyperlipidemia), HTN (hypertension), Melanoma (HCC) (12/10/2018), Squamous cell carcinoma of skin (06/10/2019), and Squamous cell carcinoma of skin (09/16/2019).  PSH:    Past Surgical History:  Procedure Laterality Date   ANTERIOR CERVICAL DECOMP/DISCECTOMY FUSION N/A 02/17/2021   Procedure: ANTERIOR CERVICAL DECOMPRESSION/DISCECTOMY FUSION CERVICAL THREE-FOUR, CERVICAL FOUR-FIVE, CERVICAL FIVE-SIX;  Surgeon: Louis Shove, MD;  Location: [facility] OR;  Service: Neurosurgery;  Laterality: N/A;   BASAL CELL CARCINOMA EXCISION  2013   right leg,FOREHEAD   COLONOSCOPY  2016   COLONOSCOPY     GREEN LIGHT LASER TURP (TRANSURETHRAL RESECTION OF PROSTATE N/A 09/21/2014   Procedure: GREEN LIGHT LASER TURP (TRANSURETHRAL RESECTION OF PROSTATE;  Surgeon: Ozell JONELLE Burkes, MD;  Location: [facility] ORS;  Service: Urology;  Laterality: N/A;   HEMORRHOID SURGERY N/A 02/28/2016   Procedure: HEMORRHOIDECTOMY;  Surgeon: Louanne KANDICE Muse, MD;  Location: [facility] ORS;  Service: General;  Laterality: N/A;   TONSILLECTOMY      Current Outpatient Medications  Medication Sig Dispense Refill   esomeprazole (NEXIUM) 40 MG capsule Take 40 mg by mouth daily before breakfast.     HYDROcodone -acetaminophen  (NORCO) 10-325 MG tablet Take 1 tablet by mouth every 4 (four) hours as needed for severe pain ((score 7 to 10)). 30 tablet 0   hydrocortisone  (ANUSOL -HC) 2.5 % rectal cream Place 1 application rectally 2 (two) times daily. As needed. 30 g 3   LYCOPENE PO Take 1 tablet by mouth daily.     Multiple Vitamins-Minerals (MULTIVITAMIN WITH MINERALS) tablet Take 1 tablet by mouth daily.  rosuvastatin  (CRESTOR ) 10 MG tablet Take 10 mg by mouth every other day.      sildenafil (VIAGRA) 100 MG tablet Take 100 mg by mouth as needed.     terazosin  (HYTRIN ) 10 MG capsule Take 1 capsule (10 mg total) by mouth  at bedtime.     triamcinolone  cream (KENALOG ) 0.1 % Apply 1 application topically 2 (two) times daily as needed. 454 g 2   zolpidem  (AMBIEN ) 5 MG tablet Take 5 mg by mouth at bedtime as needed.     valACYclovir  (VALTREX ) 1000 MG tablet Take 1 tablet (1,000 mg total) by mouth as directed. Take 2 PO at onset and 2 PO 12 hours later (Patient not taking: Reported on 01/24/2022) 30 tablet 6   No current facility-administered medications for this visit.    Allergies:   Patient has no known allergies.   Social History:  The patient  reports that he has never smoked. He has never used smokeless tobacco. He reports current alcohol use of about 1.0 standard drink of alcohol per week. He reports that he does not use drugs.   Family History:   family history includes Cancer in his father; Coronary artery disease in his maternal uncle; Ovarian cancer in his mother.    Review of Systems: Review of Systems  Constitutional: Negative.   HENT: Negative.    Respiratory: Negative.    Cardiovascular:  Positive for chest pain.  Gastrointestinal: Negative.   Musculoskeletal: Negative.   Neurological: Negative.   Psychiatric/Behavioral: Negative.    All other systems reviewed and are negative.   PHYSICAL EXAM: VS:  BP 104/64 (BP Location: Left Arm, Patient Position: Sitting, Cuff Size: Normal)   Pulse 64   Ht 5' 8 (1.727 m)   Wt 172 lb 4 oz (78.1 kg)   SpO2 98%   BMI 26.19 kg/m  , BMI Body mass index is 26.19 kg/m. Constitutional:  oriented to person, place, and time. No distress.  HENT:  Head: Grossly normal Eyes:  no discharge. No scleral icterus.  Neck: No JVD, no carotid bruits  Cardiovascular: Regular rate and rhythm, no murmurs appreciated Pulmonary/Chest: Clear to auscultation bilaterally, no wheezes or rails Abdominal: Soft.  no distension.  no tenderness.  Musculoskeletal: Normal range of motion Neurological:  normal muscle tone. Coordination normal. No atrophy Skin: Skin warm and  dry Psychiatric: normal affect, pleasant  Recent Labs: No results found for requested labs within last 365 days.    Lipid Panel No results found for: CHOL, HDL, LDLCALC, TRIG    Wt Readings from Last 3 Encounters:  02/05/23 172 lb 4 oz (78.1 kg)  01/24/22 167 lb (75.8 kg)  02/15/21 169 lb 3.2 oz (76.7 kg)     ASSESSMENT AND PLAN:  GERD On Nexium daily Has small hiatal hernia Prior EGD, no significant GERD symptoms  Chest pain/angina  cardiac CTA with no significant stenosis noted Denies chest pain on exertion  HYPERTENSION, BENIGN - BP stable off meds Off  irbesartan  150 mg Off amlodipine   Mixed hyperlipidemia  On Crestor  every other day, cholesterol trending upwards, prefers to evaluate repeat labs before changing Crestor  to daily  Melanoma Followed at [facility], underwent chemotherapy lymph node positive Has periodic CT scans  Diarrhea Stable    Orders Placed This Encounter  Procedures   EKG 12-Lead     Signed, Velinda Lunger, M.D., Ph.D. 02/05/2023  [facility] [place], [place] [phone]

## 2023-02-05 ENCOUNTER — Encounter: Payer: Self-pay | Admitting: Cardiovascular Disease

## 2023-02-05 ENCOUNTER — Ambulatory Visit: Payer: Medicare Other | Attending: Cardiovascular Disease | Admitting: Cardiovascular Disease

## 2023-02-05 VITALS — BP 104/64 | HR 64 | Ht 68.0 in | Wt 172.2 lb

## 2023-02-05 DIAGNOSIS — R0602 Shortness of breath: Secondary | ICD-10-CM | POA: Insufficient documentation

## 2023-02-05 DIAGNOSIS — I2510 Atherosclerotic heart disease of native coronary artery without angina pectoris: Secondary | ICD-10-CM | POA: Diagnosis not present

## 2023-02-05 DIAGNOSIS — E782 Mixed hyperlipidemia: Secondary | ICD-10-CM | POA: Diagnosis present

## 2023-02-05 NOTE — Patient Instructions (Signed)
 Medication Instructions:  No changes  Monitor cholesterol, try to keep it less than 150  If you need a refill on your cardiac medications before your next appointment, please call your pharmacy.   Lab work: No new labs needed  Testing/Procedures: No new testing needed  Follow-Up: At [facility], you and your health needs are our priority.  As part of our continuing mission to provide you with exceptional heart care, we have created designated Provider Care Teams.  These Care Teams include your primary Cardiologist (physician) and Advanced Practice Providers (APPs -  Physician Assistants and Nurse Practitioners) who all work together to provide you with the care you need, when you need it.  You will need a follow up appointment in 12 months  Providers on your designated Care Team:   Lonni Meager, NP Bernardino Bring, PA-C Cadence [place], [place]  COVID-19 Vaccine Information can be found at: [email or web address] For questions related to vaccine distribution or appointments, please email [email or web address] or call [phone].

## 2023-02-07 ENCOUNTER — Ambulatory Visit: Payer: Medicare Other | Admitting: Dermatology

## 2023-02-07 ENCOUNTER — Encounter: Payer: Self-pay | Admitting: Dermatology

## 2023-02-07 DIAGNOSIS — D492 Neoplasm of unspecified behavior of bone, soft tissue, and skin: Secondary | ICD-10-CM

## 2023-02-07 DIAGNOSIS — C44319 Basal cell carcinoma of skin of other parts of face: Secondary | ICD-10-CM | POA: Diagnosis not present

## 2023-02-07 DIAGNOSIS — L578 Other skin changes due to chronic exposure to nonionizing radiation: Secondary | ICD-10-CM

## 2023-02-07 DIAGNOSIS — C44712 Basal cell carcinoma of skin of right lower limb, including hip: Secondary | ICD-10-CM

## 2023-02-07 DIAGNOSIS — C4441 Basal cell carcinoma of skin of scalp and neck: Secondary | ICD-10-CM

## 2023-02-07 DIAGNOSIS — L821 Other seborrheic keratosis: Secondary | ICD-10-CM

## 2023-02-07 DIAGNOSIS — C44612 Basal cell carcinoma of skin of right upper limb, including shoulder: Secondary | ICD-10-CM | POA: Diagnosis not present

## 2023-02-07 DIAGNOSIS — L820 Inflamed seborrheic keratosis: Secondary | ICD-10-CM | POA: Diagnosis not present

## 2023-02-07 DIAGNOSIS — C44719 Basal cell carcinoma of skin of left lower limb, including hip: Secondary | ICD-10-CM

## 2023-02-07 DIAGNOSIS — C4491 Basal cell carcinoma of skin, unspecified: Secondary | ICD-10-CM

## 2023-02-07 DIAGNOSIS — W908XXA Exposure to other nonionizing radiation, initial encounter: Secondary | ICD-10-CM

## 2023-02-07 DIAGNOSIS — Z1283 Encounter for screening for malignant neoplasm of skin: Secondary | ICD-10-CM

## 2023-02-07 DIAGNOSIS — L814 Other melanin hyperpigmentation: Secondary | ICD-10-CM

## 2023-02-07 HISTORY — DX: Basal cell carcinoma of skin, unspecified: C44.91

## 2023-02-07 NOTE — Patient Instructions (Addendum)

## 2023-02-07 NOTE — Progress Notes (Signed)
Follow-Up Visit   Subjective  Martin Lopez is a 78 y.o. male who presents for the following: Skin Cancer Screening and Full Body Skin Exam, Hx of Melanoma, BCCs, SCCs, Dysplatic Nevus, Aks, check spots chest and abdomen, itchy  The patient presents for Total-Body Skin Exam (TBSE) for skin cancer screening and mole check. The patient has spots, moles and lesions to be evaluated, some may be new or changing and the patient may have concern these could be cancer.    The following portions of the chart were reviewed this encounter and updated as appropriate: medications, allergies, medical history  Review of Systems:  No other skin or systemic complaints except as noted in HPI or Assessment and Plan.  Objective  Well appearing patient in no apparent distress; mood and affect are within normal limits.  A full examination was performed including scalp, head, eyes, ears, nose, lips, neck, chest, axillae, abdomen, back, buttocks, bilateral upper extremities, bilateral lower extremities, hands, feet, fingers, toes, fingernails, and toenails. All findings within normal limits unless otherwise noted below.   Relevant physical exam findings are noted in the Assessment and Plan.  L forehead, x 2, L frontal scalp x 1, chest x 40, L lower abdomen x 1 (44) Stuck on waxy paps with erythema R top of shoulder 6.68mm pink pearly pap  L temporal scalp 5.39mm pink pearly pap with telangiectasias  R upper forehead 4.16mm pink pearly pap with pigment globules   R mid upper posterior arm 5.75mm pink scar like macule  R pretibial 14.14mm pink scaly plaque  L distal lat pretibial 4.47mm pink scaly macule   Assessment & Plan   SKIN CANCER SCREENING PERFORMED TODAY.  ACTINIC DAMAGE - Chronic condition, secondary to cumulative UV/sun exposure - diffuse scaly erythematous macules with underlying dyspigmentation - Recommend daily broad spectrum sunscreen SPF 30+ to sun-exposed areas, reapply every 2  hours as needed.  - Staying in the shade or wearing long sleeves, sun glasses (UVA+UVB protection) and wide brim hats (4-inch brim around the entire circumference of the hat) are also recommended for sun protection.  - Call for new or changing lesions.  LENTIGINES, SEBORRHEIC KERATOSES, HEMANGIOMAS - Benign normal skin lesions - Benign-appearing - Call for any changes  MELANOCYTIC NEVI - Tan-brown and/or pink-flesh-colored symmetric macules and papules - Benign appearing on exam today - Observation - Call clinic for new or changing moles - Recommend daily use of broad spectrum spf 30+ sunscreen to sun-exposed areas.    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  Currently doing well and off treatment. Has annual follow ups. Right distal deltoid. MM. 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  Continue follow-ups with oncology at [facility]  10/2022 saw [facility] surg onc - No evidence of recurrence today - Recommend regular full body skin exams - Recommend daily broad spectrum sunscreen SPF 30+ to sun-exposed areas, reapply every 2 hours as needed.  - Call if any new or changing lesions are noted between office visits   HISTORY OF BASAL CELL CARCINOMA OF THE SKIN - No evidence of recurrence today - Recommend regular full body skin exams - Recommend daily broad spectrum sunscreen SPF 30+ to sun-exposed areas, reapply every 2 hours as needed.  - Call if any new or changing lesions are noted between office visits  -Multiple  HISTORY OF DYSPLASTIC NEVUS No evidence of recurrence today Recommend regular full body skin exams Recommend daily broad spectrum sunscreen SPF  30+ to sun-exposed areas, reapply every 2 hours as needed.  Call if any new or changing lesions are noted between office visits  -R sup sacral area  HISTORY OF SQUAMOUS CELL CARCINOMA OF THE SKIN - No evidence of recurrence today - No lymphadenopathy -  Recommend regular full body skin exams - Recommend daily broad spectrum sunscreen SPF 30+ to sun-exposed areas, reapply every 2 hours as needed.  - Call if any new or changing lesions are noted between office visits -L clavicle, R prox forearm    INFLAMED SEBORRHEIC KERATOSIS (44) L forehead, x 2, L frontal scalp x 1, chest x 40, L lower abdomen x 1 (44) Symptomatic, irritating, patient would like treated. Destruction of lesion - L forehead, x 2, L frontal scalp x 1, chest x 40, L lower abdomen x 1 (44) Complexity: simple   Destruction method: cryotherapy   Informed consent: discussed and consent obtained   Timeout:  patient name, date of birth, surgical site, and procedure verified Lesion destroyed using liquid nitrogen: Yes   Region frozen until ice ball extended beyond lesion: Yes   Cryo cycles: 1 or 2. Outcome: patient tolerated procedure well with no complications   Post-procedure details: wound care instructions given   NEOPLASM OF SKIN (6) R top of shoulder Skin / nail biopsy Type of biopsy: tangential   Informed consent: discussed and consent obtained   Timeout: patient name, date of birth, surgical site, and procedure verified   Procedure prep:  Patient was prepped and draped in usual sterile fashion Prep type:  Isopropyl alcohol Anesthesia: the lesion was anesthetized in a standard fashion   Anesthetic:  1% lidocaine w/ epinephrine 1-100,000 buffered w/ 8.4% NaHCO3 Instrument used: DermaBlade   Hemostasis achieved with: pressure and aluminum chloride   Outcome: patient tolerated procedure well   Post-procedure details: sterile dressing applied and wound care instructions given   Dressing type: bandage and bacitracin   Specimen 1 - Surgical pathology Differential Diagnosis: R/O BCC  Check Margins: No 6.50mm pink pearly pap L temporal scalp Skin / nail biopsy Type of biopsy: tangential   Informed consent: discussed and consent obtained   Timeout: patient name, date  of birth, surgical site, and procedure verified   Procedure prep:  Patient was prepped and draped in usual sterile fashion Prep type:  Isopropyl alcohol Anesthesia: the lesion was anesthetized in a standard fashion   Anesthetic:  1% lidocaine w/ epinephrine 1-100,000 buffered w/ 8.4% NaHCO3 Instrument used: DermaBlade   Hemostasis achieved with: pressure and aluminum chloride   Outcome: patient tolerated procedure well   Post-procedure details: sterile dressing applied and wound care instructions given   Dressing type: bandage and bacitracin   Specimen 2 - Surgical pathology Differential Diagnosis: R/O BCC  Check Margins: No 5.32mm pink pearly pap with telangiectasias R upper forehead Skin / nail biopsy Type of biopsy: tangential   Informed consent: discussed and consent obtained   Timeout: patient name, date of birth, surgical site, and procedure verified   Procedure prep:  Patient was prepped and draped in usual sterile fashion Prep type:  Isopropyl alcohol Anesthesia: the lesion was anesthetized in a standard fashion   Anesthetic:  1% lidocaine w/ epinephrine 1-100,000 buffered w/ 8.4% NaHCO3 Instrument used: DermaBlade   Hemostasis achieved with: pressure and aluminum chloride   Outcome: patient tolerated procedure well   Post-procedure details: sterile dressing applied and wound care instructions given   Dressing type: bandage and bacitracin   Specimen 3 - Surgical pathology  Differential Diagnosis: R/O BCC  Check Margins: No 4.32mm pink pearly pap with pigment globules R mid upper posterior arm Skin / nail biopsy Type of biopsy: tangential   Informed consent: discussed and consent obtained   Timeout: patient name, date of birth, surgical site, and procedure verified   Procedure prep:  Patient was prepped and draped in usual sterile fashion Prep type:  Isopropyl alcohol Anesthesia: the lesion was anesthetized in a standard fashion   Anesthetic:  1% lidocaine w/ epinephrine  1-100,000 buffered w/ 8.4% NaHCO3 Instrument used: DermaBlade   Hemostasis achieved with: pressure and aluminum chloride   Outcome: patient tolerated procedure well   Post-procedure details: sterile dressing applied and wound care instructions given   Dressing type: bandage and bacitracin   Specimen 4 - Surgical pathology Differential Diagnosis: R/O BCC  Check Margins: No 5.38mm pink scar like macule R pretibial Skin / nail biopsy Type of biopsy: tangential   Informed consent: discussed and consent obtained   Timeout: patient name, date of birth, surgical site, and procedure verified   Procedure prep:  Patient was prepped and draped in usual sterile fashion Prep type:  Isopropyl alcohol Anesthesia: the lesion was anesthetized in a standard fashion   Anesthetic:  1% lidocaine w/ epinephrine 1-100,000 buffered w/ 8.4% NaHCO3 Instrument used: DermaBlade   Hemostasis achieved with: pressure and aluminum chloride   Outcome: patient tolerated procedure well   Post-procedure details: sterile dressing applied and wound care instructions given   Dressing type: bandage and bacitracin   Specimen 5 - Surgical pathology Differential Diagnosis: R/O BCC  Check Margins: No 14.54mm pink scaly plaque L distal lat pretibial Skin / nail biopsy Type of biopsy: tangential   Informed consent: discussed and consent obtained   Timeout: patient name, date of birth, surgical site, and procedure verified   Procedure prep:  Patient was prepped and draped in usual sterile fashion Prep type:  Isopropyl alcohol Anesthesia: the lesion was anesthetized in a standard fashion   Anesthetic:  1% lidocaine w/ epinephrine 1-100,000 buffered w/ 8.4% NaHCO3 Instrument used: DermaBlade   Hemostasis achieved with: pressure and aluminum chloride   Outcome: patient tolerated procedure well   Post-procedure details: sterile dressing applied and wound care instructions given   Dressing type: bandage and bacitracin    Specimen 6 - Surgical pathology Differential Diagnosis: R/O BCC  Check Margins: No 4.90mm pink scaly macule Discussed that lower extremity BCC are low risk and may not require treatment. Patient opts for biopsy and treatment Return in about 6 months (around 08/07/2023) for TBSE, Hx of Melanoma, Hx of BCC, Hx of SCC, Hx of Dysplastic nevi, Hx of AKs.  I, Ardis Rowan, RMA, am acting as scribe for Elie Goody, MD .   Documentation: I have reviewed the above documentation for accuracy and completeness, and I agree with the above.  Elie Goody, MD

## 2023-02-11 LAB — SURGICAL PATHOLOGY

## 2023-02-13 ENCOUNTER — Telehealth: Payer: Self-pay

## 2023-02-13 DIAGNOSIS — C44319 Basal cell carcinoma of skin of other parts of face: Secondary | ICD-10-CM

## 2023-02-13 NOTE — Telephone Encounter (Signed)
-----   Message from [facility] sent at 02/12/2023  7:48 PM EST ----- Diagnosis: 1. Skin, R top of shoulder :       SUPERFICIAL AND NODULAR BASAL CELL CARCINOMA        2. Skin, L temporal scalp :       BASAL CELL CARCINOMA, NODULAR PATTERN        3. Skin, R upper forehead :       BASAL CELL CARCINOMA, NODULAR PATTERN, ULCERATED        4. Skin, R mid upper posterior arm :       SUPERFICIAL BASAL CELL CARCINOMA        5. Skin, R pretibial :       SUPERFICIAL BASAL CELL CARCINOMA        6. Skin, L distal lat pretibial :       SUPERFICIAL BASAL CELL CARCINOMA    Please call with diagnosis and treatment options.  SITE 1 Explanation: your biopsy shows basal cell skin cancers in the second layer of the skin.    Treatment option 1: you return for a brief appointment where I perform electrodesiccation and curettage [facility]). This involves three rounds of scraping and burning to destroy the skin cancer. It has about an 85% cure rate and leaves a round wound slightly larger than the skin cancer and leaves a round white scar. No additional pathology is done. If the skin cancer comes back, we would need to do a surgery to remove it.   Treatment option 2: Excision - you return for an hour long appointment in our clinic where we perform a skin surgery. We numb the site of the skin cancer and a safety margin of normal skin around it. We remove the full thickness of skin and close the wound with two layers of stitches. The sample is sent to the lab to check that the skin cancer was fully removed. Return one week later to have wound checked and surface stitches removed. Surgical wound leaves a line scar. Approximately 95% cure rate. Risk of recurrence, bleeding, infection, pain, injury to nearby structures, hypertrophic scar.   SITES 2 and 3  Explanation: your biopsy shows a basal cell skin cancer in the second layer of the skin. Given the location and type of skin cancer, I recommend Mohs surgery.    Treatment: Mohs surgery involves cutting out the skin cancer and then checking under the microscope to ensure the whole skin cancer was removed. If any skin cancer remains, the surgeon will cut out more until it is fully removed. The cure rate is about 98-99%. Once the Mohs surgeon confirms the skin cancer is out, they will discuss the options to repair or heal the area. You must take it easy for about two weeks after surgery (no lifting over 10-15 lbs, avoid activity to get your heart rate and blood pressure up). It is done at another office outside of [place] ([place], [place], or [place]).   SITES 4 5 and 6 Explanation: your biopsy shows a basal cell skin cancer limited to the top layer of skin.   Treatment: ED&C

## 2023-02-14 NOTE — Addendum Note (Signed)
Addended by: Dorathy Daft R on: 02/14/2023 11:06 AM   Modules accepted: Orders

## 2023-02-14 NOTE — Telephone Encounter (Signed)
Patient advised of BX results.  Scheduled surgery of R top of shoulder, patients wants referral to Dr. Adriana Simas for [facility] and will discuss [facility] with Dr. Katrinka Blazing.   Patient would like to know can we schedule longer appt time/surgery slot? For the 3 EDC sites to be treated at post op?

## 2023-02-18 NOTE — Telephone Encounter (Signed)
Left VM for patient to return my call. aw

## 2023-02-19 NOTE — Telephone Encounter (Signed)
I have called patient two more times.   No answer and straight to VM. Advised pt his phone does not ring. aw

## 2023-04-03 ENCOUNTER — Encounter: Payer: Medicare Other | Admitting: Dermatology

## 2023-04-10 ENCOUNTER — Encounter: Payer: Medicare Other | Admitting: Dermatology

## 2023-04-17 ENCOUNTER — Encounter: Payer: Medicare Other | Admitting: Dermatology

## 2023-04-24 ENCOUNTER — Encounter: Payer: Self-pay | Admitting: Dermatology

## 2023-04-24 ENCOUNTER — Ambulatory Visit: Admitting: Dermatology

## 2023-04-24 DIAGNOSIS — C44612 Basal cell carcinoma of skin of right upper limb, including shoulder: Secondary | ICD-10-CM

## 2023-04-24 DIAGNOSIS — L820 Inflamed seborrheic keratosis: Secondary | ICD-10-CM | POA: Diagnosis not present

## 2023-04-24 MED ORDER — MUPIROCIN 2 % EX OINT: TOPICAL_OINTMENT | CUTANEOUS | 1 refills | Status: AC

## 2023-04-24 NOTE — Patient Instructions (Addendum)

## 2023-04-24 NOTE — Progress Notes (Unsigned)
 Follow-Up Visit   Subjective  Martin Lopez is a 78 y.o. male who presents for the following: Excision of biopsy proven superficial BCC on the right top of shoulder.   The following portions of the chart were reviewed this encounter and updated as appropriate: medications, allergies, medical history  Review of Systems:  No other skin or systemic complaints except as noted in HPI or Assessment and Plan.  Objective  Well appearing patient in no apparent distress; mood and affect are within normal limits.  A focused examination was performed of the following areas: right shoulder   Relevant physical exam findings are noted in the Assessment and Plan.   right top of shoulder Healing biopsy site  left temple x 7 (7) Stuck-on, waxy, tan-brown papule or plaque --Discussed benign etiology and prognosis.   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 INCLUDING SHOULDER right top of shoulder Skin excision  Informed consent: discussed and consent obtained   Timeout: patient name, date of birth, surgical site, and procedure verified   Procedure prep:  Patient was prepped and draped in usual sterile fashion Prep type:  Chlorhexidine Anesthesia: the lesion was anesthetized in a standard fashion   Anesthetic:  1% lidocaine w/ epinephrine 1-100,000 buffered w/ 8.4% NaHCO3 (9 cc) Instrument used: #15 blade   Hemostasis achieved with: pressure   Outcome: patient tolerated procedure well with no complications   Additional details:  Tagged lateral  Skin repair Complexity:  Intermediate Final length (cm):  4.4 Informed consent: discussed and consent obtained   Timeout: patient name, date of birth, surgical site, and procedure verified   Procedure prep:  Patient was prepped and draped in usual sterile fashion Prep type:  Chlorhexidine Anesthesia: the lesion was anesthetized in a standard fashion   Anesthetic:  1% lidocaine w/ epinephrine 1-100,000  buffered w/ 8.4% NaHCO3 Reason for type of repair: reduce tension to allow closure, reduce the risk of dehiscence, infection, and necrosis, reduce subcutaneous dead space and avoid a hematoma, allow closure of the large defect and preserve normal anatomy   Undermining: edges could be approximated without difficulty   Subcutaneous layers (deep stitches):  Suture size:  3-0 Suture type: Monocryl (poliglecaprone 25)   Stitches:  Buried vertical mattress Fine/surface layer approximation (top stitches):  Suture size:  4-0 Suture type: Prolene (polypropylene)   Stitches: simple running   Suture removal (days):  14 Hemostasis achieved with: suture, pressure and electrodesiccation Outcome: patient tolerated procedure well with no complications   Post-procedure details: sterile dressing applied and wound care instructions given   Dressing type: petrolatum, bandage and pressure dressing   Specimen 1 - Surgical pathology Differential Diagnosis: BX Proven BCC   Check Margins: Yes  Tagged lateral [id number] Start Mupirocin ointment apply qd-bid during each bandage change INFLAMED SEBORRHEIC KERATOSIS (7) left temple x 7 (7) Symptomatic, irritating, patient would like treated.  Destruction of lesion - left temple x 7 (7) Complexity: simple   Destruction method: cryotherapy   Informed consent: discussed and consent obtained   Timeout:  patient name, date of birth, surgical site, and procedure verified Lesion destroyed using liquid nitrogen: Yes   Region frozen until ice ball extended beyond lesion: Yes   Outcome: patient tolerated procedure well with no complications   Post-procedure details: wound care instructions given     Return in about 2 weeks (around 05/08/2023) for suture removal and EDC x 3   30 min appointment.  Andee Poles  Chrisandra Carota, CMA, am acting as scribe for Elie Goody, MD .   Documentation: I have reviewed the above documentation for accuracy and completeness, and I  agree with the above.  Elie Goody, MD

## 2023-04-25 ENCOUNTER — Encounter: Payer: Self-pay | Admitting: Dermatology

## 2023-04-29 LAB — SURGICAL PATHOLOGY

## 2023-05-07 ENCOUNTER — Ambulatory Visit (INDEPENDENT_AMBULATORY_CARE_PROVIDER_SITE_OTHER): Admitting: Dermatology

## 2023-05-07 ENCOUNTER — Encounter: Payer: Self-pay | Admitting: Dermatology

## 2023-05-07 DIAGNOSIS — C44712 Basal cell carcinoma of skin of right lower limb, including hip: Secondary | ICD-10-CM | POA: Diagnosis not present

## 2023-05-07 DIAGNOSIS — C44719 Basal cell carcinoma of skin of left lower limb, including hip: Secondary | ICD-10-CM | POA: Diagnosis not present

## 2023-05-07 DIAGNOSIS — L820 Inflamed seborrheic keratosis: Secondary | ICD-10-CM

## 2023-05-07 DIAGNOSIS — C44612 Basal cell carcinoma of skin of right upper limb, including shoulder: Secondary | ICD-10-CM | POA: Diagnosis not present

## 2023-05-07 DIAGNOSIS — L111 Transient acantholytic dermatosis [Grover]: Secondary | ICD-10-CM

## 2023-05-07 DIAGNOSIS — C4491 Basal cell carcinoma of skin, unspecified: Secondary | ICD-10-CM

## 2023-05-07 MED ORDER — TRIAMCINOLONE ACETONIDE 0.1 % EX CREA: 1.0000 | TOPICAL_CREAM | Freq: Two times a day (BID) | CUTANEOUS | 2 refills | Status: AC | PRN

## 2023-05-07 NOTE — Patient Instructions (Signed)

## 2023-05-07 NOTE — Progress Notes (Signed)
 Follow-Up Visit   Subjective  Martin Lopez is a 78 y.o. male who presents for the following: suture removal and EDC x 3.   The patient has spots, moles and lesions to be evaluated, some may be new or changing and the patient may have concern these could be cancer.   The following portions of the chart were reviewed this encounter and updated as appropriate: medications, allergies, medical history  Review of Systems:  No other skin or systemic complaints except as noted in HPI or Assessment and Plan.  Objective  Well appearing patient in no apparent distress; mood and affect are within normal limits.   A focused examination was performed of the following areas: Legs, arms, shoulder  Relevant exam findings are noted in the Assessment and Plan.  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 Benign itchy bumps that sporadically occur  Treatment Plan: Start TMC 0.1% cr 1-2 times daily prn. Avoid applying to face, groin, and axilla. Use as directed. Long-term use can cause thinning of the skin. Gave sample of impoyz clobetasol for BID use until clear  Topical steroids (such as triamcinolone, fluocinolone, fluocinonide, mometasone, clobetasol, halobetasol, betamethasone, hydrocortisone) can cause thinning and lightening of the skin if they are used for too long in the same area. Your physician has selected the right strength medicine for your problem and area affected on the body. Please use your medication only as directed by your physician to prevent side effects.   Encounter for Removal of Sutures - Incision site at the right top of shoulder is clean, dry and intact - Wound cleansed, sutures  removed, wound cleansed and steri strips applied.  - Discussed pathology results showing margins free, no residual BCC  - Patient advised to keep steri-strips dry until they fall off. - Scars remodel for a full year. - Once steri-strips fall off, patient can apply over-the-counter silicone scar cream each night to help with scar remodeling if desired. - Patient advised to call with any concerns or if they notice any new or changing lesions.  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 INCLUDING SHOULDER right mid upper posterior arm Destruction of lesion Complexity: simple   Destruction method: electrodesiccation and curettage   Informed consent: discussed and consent obtained   Timeout:  patient name, date of birth, surgical site, and procedure verified Procedure prep:  Patient was prepped and draped in usual sterile fashion Prep type:  Isopropyl alcohol Anesthesia: the lesion was anesthetized in a standard fashion   Anesthetic:  1% lidocaine w/ epinephrine 1-100,000 local infiltration Curettage performed in three different directions: Yes   Electrodesiccation performed over the curetted area: Yes   Curettage cycles:  3 Final wound size (cm):  1.2 Hemostasis achieved with:  aluminum chloride and electrodesiccation Outcome: patient tolerated procedure well with no complications   Post-procedure details: sterile dressing applied and wound care instructions given   Dressing type: bandage and petrolatum   BASAL CELL CARCINOMA (BCC), UNSPECIFIED SITE (2) right pretibial Destruction of lesion Complexity: simple   Destruction method: electrodesiccation and curettage   Informed consent: discussed and consent obtained   Timeout:  patient name, date of birth, surgical site, and  procedure verified Procedure prep:  Patient was prepped and draped in usual sterile fashion Prep type:  Isopropyl alcohol Anesthesia: the lesion was anesthetized in a standard fashion   Anesthetic:   1% lidocaine w/ epinephrine 1-100,000 local infiltration Curettage performed in three different directions: Yes   Electrodesiccation performed over the curetted area: Yes   Curettage cycles:  3 Final wound size (cm):  1.1 Hemostasis achieved with:  aluminum chloride and electrodesiccation Outcome: patient tolerated procedure well with no complications   Post-procedure details: sterile dressing applied and wound care instructions given   Dressing type: bandage and petrolatum   left distal lateral pretibial Destruction of lesion Complexity: simple   Destruction method: electrodesiccation and curettage   Informed consent: discussed and consent obtained   Timeout:  patient name, date of birth, surgical site, and procedure verified Procedure prep:  Patient was prepped and draped in usual sterile fashion Prep type:  Isopropyl alcohol Anesthesia: the lesion was anesthetized in a standard fashion   Anesthetic:  1% lidocaine w/ epinephrine 1-100,000 local infiltration Curettage performed in three different directions: Yes   Electrodesiccation performed over the curetted area: Yes   Curettage cycles:  3 Final wound size (cm):  1 Hemostasis achieved with:  aluminum chloride and electrodesiccation Outcome: patient tolerated procedure well with no complications   Post-procedure details: sterile dressing applied and wound care instructions given   Dressing type: bandage and petrolatum   INFLAMED SEBORRHEIC KERATOSIS (4) L waistline x 3, L lat mid chest x 1 (4) Symptomatic, irritating, patient would like treated.  Benign-appearing.  Call clinic for new or changing lesions.   Destruction of lesion - L waistline x 3, L lat mid chest x 1 (4) Complexity: simple   Destruction method: cryotherapy   Informed consent: discussed and consent obtained   Timeout:  patient name, date of birth, surgical site, and procedure verified Lesion destroyed using liquid nitrogen: Yes   Region frozen until ice ball  extended beyond lesion: Yes   Cryo cycles: 1 or 2. Outcome: patient tolerated procedure well with no complications   Post-procedure details: wound care instructions given   GROVER'S DISEASE    Return for TBSE, as scheduled, with Dr. Felipe Horton, HxMM, HxSCC, HxBCC, HxDN, HxAK.  Kerstin Peeling, RMA, am acting as scribe for Harris Liming, MD .   Documentation: I have reviewed the above documentation for accuracy and completeness, and I agree with the above.  Harris Liming, MD

## 2023-05-09 ENCOUNTER — Ambulatory Visit: Admitting: Dermatology

## 2023-07-30 ENCOUNTER — Telehealth: Payer: Self-pay

## 2023-07-30 NOTE — Telephone Encounter (Signed)
 Patient called office today demanding to be seen with a new developing rash. When I returned patient's call from the nurse voicemail line he said that is was unacceptable to not be seen today because it would only take 3 minutes and he was on the way to our office. When patient arrived, I advised him the same thing as I did on the phone, we could not see him today (nothing available and we are short staffed) but we could see him in the morning with Dr. Jackquline at 9:45. Patient declined and stated this was not good because he was a established patient and just wanted a nurse to look at it. Patient advised again that a MA can not make any diagnosis and patient walked out. He came back into the office and wanted the 9:45 appt.   Patient called back at a later time and cancelled appt because PCP got him in this afternoon.

## 2023-07-31 ENCOUNTER — Ambulatory Visit: Admitting: Dermatology

## 2023-08-06 ENCOUNTER — Encounter: Payer: Self-pay | Admitting: Dermatology

## 2023-08-06 ENCOUNTER — Ambulatory Visit (INDEPENDENT_AMBULATORY_CARE_PROVIDER_SITE_OTHER): Payer: Medicare Other | Admitting: Dermatology

## 2023-08-06 DIAGNOSIS — B070 Plantar wart: Secondary | ICD-10-CM

## 2023-08-06 DIAGNOSIS — L814 Other melanin hyperpigmentation: Secondary | ICD-10-CM | POA: Diagnosis not present

## 2023-08-06 DIAGNOSIS — D1801 Hemangioma of skin and subcutaneous tissue: Secondary | ICD-10-CM

## 2023-08-06 DIAGNOSIS — Z85820 Personal history of malignant melanoma of skin: Secondary | ICD-10-CM

## 2023-08-06 DIAGNOSIS — D229 Melanocytic nevi, unspecified: Secondary | ICD-10-CM

## 2023-08-06 DIAGNOSIS — Z1283 Encounter for screening for malignant neoplasm of skin: Secondary | ICD-10-CM

## 2023-08-06 DIAGNOSIS — Z872 Personal history of diseases of the skin and subcutaneous tissue: Secondary | ICD-10-CM

## 2023-08-06 DIAGNOSIS — W908XXA Exposure to other nonionizing radiation, initial encounter: Secondary | ICD-10-CM

## 2023-08-06 DIAGNOSIS — L821 Other seborrheic keratosis: Secondary | ICD-10-CM

## 2023-08-06 DIAGNOSIS — Z86018 Personal history of other benign neoplasm: Secondary | ICD-10-CM

## 2023-08-06 DIAGNOSIS — Z85828 Personal history of other malignant neoplasm of skin: Secondary | ICD-10-CM

## 2023-08-06 DIAGNOSIS — D692 Other nonthrombocytopenic purpura: Secondary | ICD-10-CM

## 2023-08-06 DIAGNOSIS — L578 Other skin changes due to chronic exposure to nonionizing radiation: Secondary | ICD-10-CM

## 2023-08-06 NOTE — Patient Instructions (Addendum)

## 2023-08-06 NOTE — Progress Notes (Signed)
 Follow-Up Visit   Subjective  Martin Lopez is a 78 y.o. male who presents for the following: Skin Cancer Screening and Full Body Skin Exam. Hx of Melanoma, BCCs, SCCs, Dysplatic Nevus, Aks.  The patient presents for Total-Body Skin Exam (TBSE) for skin cancer screening and mole check. The patient has spots, moles and lesions to be evaluated, some may be new or changing and the patient may have concern these could be cancer.   The following portions of the chart were reviewed this encounter and updated as appropriate: medications, allergies, medical history  Review of Systems:  No other skin or systemic complaints except as noted in HPI or Assessment and Plan.  Objective  Well appearing patient in no apparent distress; mood and affect are within normal limits.  A full examination was performed including scalp, head, eyes, ears, nose, lips, neck, chest, axillae, abdomen, back, buttocks, bilateral upper extremities, bilateral lower extremities, hands, feet, fingers, toes, fingernails, and toenails. All findings within normal limits unless otherwise noted below.   Relevant physical exam findings are noted in the Assessment and Plan.  Right Plantar Surface of Heel Verrucous papules -- Discussed viral etiology and contagion.  Assessment & Plan   SKIN CANCER SCREENING PERFORMED TODAY.  Purpura - Chronic; persistent and recurrent.  Treatable, but not curable. - Violaceous macules and patches - Benign - Related to trauma, age, sun damage and/or use of blood thinners, chronic use of topical and/or oral steroids - Observe - Can use OTC arnica containing moisturizer such as Dermend Bruise Formula if desired - Call for worsening or other concerns  ACTINIC DAMAGE - Chronic condition, secondary to cumulative UV/sun exposure - diffuse scaly erythematous macules with underlying dyspigmentation - Recommend daily broad spectrum sunscreen SPF 30+ to sun-exposed areas, reapply every 2 hours as  needed.  - Staying in the shade or wearing long sleeves, sun glasses (UVA+UVB protection) and wide brim hats (4-inch brim around the entire circumference of the hat) are also recommended for sun protection.  - Call for new or changing lesions.  LENTIGINES, SEBORRHEIC KERATOSES, HEMANGIOMAS - Benign normal skin lesions - Benign-appearing - Call for any changes  MELANOCYTIC NEVI - Tan-brown and/or pink-flesh-colored symmetric macules and papules - Benign appearing on exam today - Observation - Call clinic for new or changing moles - Recommend daily use of broad spectrum spf 30+ sunscreen to sun-exposed areas.   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  Currently doing well and off treatment. Has annual follow ups. Right distal deltoid. 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  Continue follow-ups with oncology at [facility]  10/2022 saw [facility] surg onc - No evidence of recurrence today - Recommend regular full body skin exams - Recommend daily broad spectrum sunscreen SPF 30+ to sun-exposed areas, reapply every 2 hours as needed.  - Call if any new or changing lesions are noted between office visits    HISTORY OF BASAL CELL CARCINOMA OF THE SKIN Multiple see history L temporal scalp- scheduled for Mohs w/ Dr. Bluford 10/08/23 R upper forehead- scheduled for Mohs w/ Dr. Bluford 10/08/23 - No evidence of recurrence today - Recommend regular full body skin exams - Recommend daily broad spectrum sunscreen SPF 30+ to sun-exposed areas, reapply every 2 hours as needed.  - Call if any new or changing lesions are noted between office visits    HISTORY OF DYSPLASTIC NEVUS R sup sacral area No evidence of recurrence today  Recommend regular full body skin exams Recommend daily broad spectrum sunscreen SPF 30+ to sun-exposed areas, reapply every 2 hours as needed.  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  - Call if any new or changing lesions are noted between office visits   PLANTAR WART Right Plantar Surface of Heel Viral Wart (HPV) Counseling  Discussed viral / HPV (Human Papilloma Virus) etiology and risk of spread /infectivity to other areas of body as well as to other people.  Multiple treatments and methods may be required to clear warts and it is possible treatment may not be successful.  Treatment risks include discoloration; scarring and there is still potential for wart recurrence. MULTIPLE BENIGN NEVI   LENTIGINES   ACTINIC ELASTOSIS   SEBORRHEIC KERATOSES   CHERRY ANGIOMA   SOLAR PURPURA (HCC)    Return in about 6 months (around 02/06/2024) for TBSE, HxDysplastic Nevi, HxMM, HxBCC, HxSCC.  I, Jacquelynn V. Wilfred, CMA, am acting as scribe for Boneta Sharps, MD .   Documentation: I have reviewed the above documentation for accuracy and completeness, and I agree with the above.  Boneta Sharps, MD

## 2023-10-23 ENCOUNTER — Telehealth: Payer: Self-pay

## 2023-10-23 NOTE — Telephone Encounter (Signed)
 Updated specimen tracking and history from [facility] progress notes of Left temporal scalp and right upper forehead. aw

## 2024-01-30 ENCOUNTER — Encounter: Payer: Self-pay | Admitting: Dermatology

## 2024-01-30 ENCOUNTER — Ambulatory Visit: Admitting: Dermatology

## 2024-01-30 DIAGNOSIS — W908XXA Exposure to other nonionizing radiation, initial encounter: Secondary | ICD-10-CM | POA: Diagnosis not present

## 2024-01-30 DIAGNOSIS — D485 Neoplasm of uncertain behavior of skin: Secondary | ICD-10-CM

## 2024-01-30 DIAGNOSIS — L578 Other skin changes due to chronic exposure to nonionizing radiation: Secondary | ICD-10-CM

## 2024-01-30 DIAGNOSIS — C44519 Basal cell carcinoma of skin of other part of trunk: Secondary | ICD-10-CM

## 2024-01-30 DIAGNOSIS — Z85820 Personal history of malignant melanoma of skin: Secondary | ICD-10-CM

## 2024-01-30 DIAGNOSIS — D1801 Hemangioma of skin and subcutaneous tissue: Secondary | ICD-10-CM | POA: Diagnosis not present

## 2024-01-30 DIAGNOSIS — Z1283 Encounter for screening for malignant neoplasm of skin: Secondary | ICD-10-CM | POA: Diagnosis not present

## 2024-01-30 DIAGNOSIS — L820 Inflamed seborrheic keratosis: Secondary | ICD-10-CM | POA: Diagnosis not present

## 2024-01-30 DIAGNOSIS — L821 Other seborrheic keratosis: Secondary | ICD-10-CM

## 2024-01-30 DIAGNOSIS — D229 Melanocytic nevi, unspecified: Secondary | ICD-10-CM

## 2024-01-30 DIAGNOSIS — B070 Plantar wart: Secondary | ICD-10-CM

## 2024-01-30 DIAGNOSIS — L111 Transient acantholytic dermatosis [Grover]: Secondary | ICD-10-CM | POA: Diagnosis not present

## 2024-01-30 DIAGNOSIS — L814 Other melanin hyperpigmentation: Secondary | ICD-10-CM | POA: Diagnosis not present

## 2024-01-30 DIAGNOSIS — C4491 Basal cell carcinoma of skin, unspecified: Secondary | ICD-10-CM

## 2024-01-30 HISTORY — DX: Basal cell carcinoma of skin, unspecified: C44.91

## 2024-01-30 NOTE — Progress Notes (Signed)
 "  Follow-Up Visit   Subjective  Martin Lopez is a 79 y.o. male who presents for the following: Skin Cancer Screening and Full Body Skin Exam  The patient presents for Total-Body Skin Exam (TBSE) for skin cancer screening and mole check. The patient has spots, moles and lesions to be evaluated, some may be new or changing and the patient may have concern these could be cancer.  The following portions of the chart were reviewed this encounter and updated as appropriate: medications, allergies, medical history  Review of Systems:  No other skin or systemic complaints except as noted in HPI or Assessment and Plan.  Objective  Well appearing patient in no apparent distress; mood and affect are within normal limits.  A full examination was performed including scalp, head, eyes, ears, nose, lips, neck, chest, axillae, abdomen, back, buttocks, bilateral upper extremities, bilateral lower extremities, hands, feet, fingers, toes, fingernails, and toenails. All findings within normal limits unless otherwise noted below.   Relevant physical exam findings are noted in the Assessment and Plan.  L sup forehead x 1, R ear x 1, scalp x 7 (9) Erythematous stuck-on, waxy papule or plaque L plantar foot Verrucous papules -- Discussed viral etiology and contagion. L lower paraspinal back 0.6 cm pink papule with pigment globules.  Assessment & Plan   SKIN CANCER SCREENING PERFORMED TODAY.  ACTINIC DAMAGE - Chronic condition, secondary to cumulative UV/sun exposure - diffuse scaly erythematous macules with underlying dyspigmentation - Recommend daily broad spectrum sunscreen SPF 30+ to sun-exposed areas, reapply every 2 hours as needed.  - Staying in the shade or wearing long sleeves, sun glasses (UVA+UVB protection) and wide brim hats (4-inch brim around the entire circumference of the hat) are also recommended for sun protection.  - Call for new or changing lesions.  LENTIGINES, SEBORRHEIC  KERATOSES, HEMANGIOMAS - Benign normal skin lesions - Benign-appearing - Call for any changes  MELANOCYTIC NEVI - Tan-brown and/or pink-flesh-colored symmetric macules and papules - Benign appearing on exam today - Observation - Call clinic for new or changing moles - Recommend daily use of broad spectrum spf 30+ sunscreen to sun-exposed areas.   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  Currently doing well and off treatment. Has annual follow ups. Right distal deltoid. MM. Spindle cell/desmoplsastic melanoma. Breslow's 1.59mm, Clark's level III.  Continue follow-ups with oncology at [facility]  10/2022 saw [facility] surg onc - No evidence of recurrence today - Recommend regular full body skin exams - Recommend daily broad spectrum sunscreen SPF 30+ to sun-exposed areas, reapply every 2 hours as needed.  - Call if any new or changing lesions are noted between office visits    HISTORY OF BASAL CELL CARCINOMA OF THE SKIN Multiple see history L temporal scalp- scheduled for Mohs w/ Dr. Bluford 10/08/23 R upper forehead- scheduled for Mohs w/ Dr. Bluford 10/08/23 - No evidence of recurrence today - Recommend regular full body skin exams - Recommend daily broad spectrum sunscreen SPF 30+ to sun-exposed areas, reapply every 2 hours as needed.  - Call if any new or changing lesions are noted between office visits    HISTORY OF DYSPLASTIC NEVUS R sup sacral area No evidence of recurrence today Recommend regular full body skin exams Recommend daily broad spectrum sunscreen SPF 30+ to sun-exposed areas, reapply every 2 hours as needed.  Call if any new or changing lesions are noted between office visits    HISTORY OF SQUAMOUS  CELL CARCINOMA OF THE SKIN -L clavicle, R prox forearm - No evidence of recurrence today - Recommend regular full body skin exams - Recommend daily broad spectrum sunscreen SPF 30+ to sun-exposed areas, reapply every 2 hours as needed.   - Call if any new or changing lesions are noted between office visits INFLAMED SEBORRHEIC KERATOSIS (9) L sup forehead x 1, R ear x 1, scalp x 7 (9) Symptomatic, irritating, patient would like treated.  - Destruction of lesion - L sup forehead x 1, R ear x 1, scalp x 7 (9) Complexity: simple   Destruction method: cryotherapy   Informed consent: discussed and consent obtained   Timeout:  patient name, date of birth, surgical site, and procedure verified Lesion destroyed using liquid nitrogen: Yes   Region frozen until ice ball extended beyond lesion: Yes   Cryo cycles: 1 or 2. Outcome: patient tolerated procedure well with no complications   Post-procedure details: wound care instructions given    PLANTAR WART L plantar foot Viral Wart (HPV) Counseling  Discussed viral / HPV (Human Papilloma Virus) etiology and risk of spread /infectivity to other areas of body as well as to other people.  Multiple treatments and methods may be required to clear warts and it is possible treatment may not be successful.  Treatment risks include discoloration; scarring and there is still potential for wart recurrence. NEOPLASM OF UNCERTAIN BEHAVIOR OF SKIN L lower paraspinal back - Skin / nail biopsy Type of biopsy: tangential   Informed consent: discussed and consent obtained   Timeout: patient name, date of birth, surgical site, and procedure verified   Procedure prep:  Patient was prepped and draped in usual sterile fashion Prep type:  Isopropyl alcohol Anesthesia: the lesion was anesthetized in a standard fashion   Anesthetic:  1% lidocaine  w/ epinephrine 1-100,000 buffered w/ 8.4% NaHCO3 Instrument used: DermaBlade   Hemostasis achieved with: pressure and aluminum chloride   Outcome: patient tolerated procedure well   Post-procedure details: sterile dressing applied and wound care instructions given   Dressing type: bandage and petrolatum    Specimen 1 - Surgical pathology Differential  Diagnosis: D48.5 r/o BCC Check Margins: No  Grovers, 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 Benign itchy bumps that sporadically occur   Treatment Plan: D/C TMC since pt reports not well controlled.   Start Clobetasol 0.05% cream to aa's BID until clear. Then PRN thereafter.   Topical steroids (such as triamcinolone , fluocinolone, fluocinonide, mometasone, clobetasol, halobetasol, betamethasone, hydrocortisone ) can cause thinning and lightening of the skin if they are used for too long in the same area. Your physician has selected the right strength medicine for your problem and area affected on the body. Please use your medication only as directed by your physician to prevent side effects.   Return in about 6 months (around 07/29/2024) for TBSE - hx BCC, SCC, MM.  I, Rosina Mayans, CMA, am acting as scribe for Boneta Sharps, MD .   Documentation: I have reviewed the above documentation for accuracy and completeness, and I agree with the above.  Boneta Sharps, MD    "

## 2024-01-30 NOTE — Patient Instructions (Addendum)

## 2024-01-31 LAB — SURGICAL PATHOLOGY

## 2024-02-02 ENCOUNTER — Ambulatory Visit: Payer: Self-pay | Admitting: Dermatology

## 2024-02-03 ENCOUNTER — Encounter: Payer: Self-pay | Admitting: Dermatology

## 2024-02-03 NOTE — Telephone Encounter (Signed)
 Patient advised of BX results and would like to think about treatment and call back this week to schedule. aw

## 2024-02-03 NOTE — Telephone Encounter (Signed)
 Left message for patient to return call regarding biopsy results.

## 2024-02-03 NOTE — Telephone Encounter (Signed)
-----   Message from Boneta Sharps, MD sent at 02/02/2024  5:04 PM EST ----- Diagnosis: L lower paraspinal back :       BASAL CELL CARCINOMA, NODULAR PATTERN   Please call to share diagnosis and discuss treatment options.  Explanation: your biopsy shows basal cell skin cancer in the second layer of the skin. Treatment: EDC vs excision

## 2024-02-04 ENCOUNTER — Encounter: Payer: Self-pay | Admitting: Dermatology

## 2024-02-04 NOTE — Telephone Encounter (Signed)
 Patient came in person to discuss biopsy results and treatment options. Patient voiced understanding to all and ED&C scheduled for 02/18/24 with Dr. Claudene.

## 2024-02-18 ENCOUNTER — Ambulatory Visit: Admitting: Dermatology

## 2024-02-27 ENCOUNTER — Ambulatory Visit: Admitting: Dermatology

## 2024-03-03 ENCOUNTER — Ambulatory Visit: Admitting: Dermatology

## 2024-03-17 ENCOUNTER — Ambulatory Visit: Admitting: Dermatology

## 2024-05-04 ENCOUNTER — Ambulatory Visit: Admitting: Cardiovascular Disease

## 2024-07-30 ENCOUNTER — Ambulatory Visit: Admitting: Dermatology
